# Patient Record
Sex: Male | Born: 1970 | Race: White | Hispanic: No | Marital: Married | Smoking: Never smoker
Health system: Southern US, Community
[De-identification: ages and names within clinical notes are randomized; demographics above are authoritative.]

## PROBLEM LIST (undated history)

## (undated) DIAGNOSIS — T7840XA Allergy, unspecified, initial encounter: Secondary | ICD-10-CM

## (undated) DIAGNOSIS — N529 Male erectile dysfunction, unspecified: Secondary | ICD-10-CM

## (undated) HISTORY — DX: Male erectile dysfunction, unspecified: N52.9

## (undated) HISTORY — DX: Allergy, unspecified, initial encounter: T78.40XA

---

## 2006-11-09 ENCOUNTER — Emergency Department (HOSPITAL_COMMUNITY): Admission: EM | Admit: 2006-11-09 | Discharge: 2006-11-09 | Payer: Self-pay | Admitting: Emergency Medicine

## 2006-11-22 ENCOUNTER — Ambulatory Visit: Payer: Self-pay | Admitting: Family Medicine

## 2007-03-18 ENCOUNTER — Ambulatory Visit: Payer: Self-pay | Admitting: Family Medicine

## 2008-05-04 HISTORY — PX: WISDOM TOOTH EXTRACTION: SHX21

## 2008-06-05 ENCOUNTER — Inpatient Hospital Stay (HOSPITAL_COMMUNITY): Admission: EM | Admit: 2008-06-05 | Discharge: 2008-06-06 | Payer: Self-pay | Admitting: Emergency Medicine

## 2008-09-10 ENCOUNTER — Ambulatory Visit: Payer: Self-pay | Admitting: Family Medicine

## 2009-03-04 LAB — HM COLONOSCOPY: HM Colonoscopy: NORMAL

## 2009-03-25 ENCOUNTER — Ambulatory Visit: Payer: Self-pay | Admitting: Family Medicine

## 2009-04-07 IMAGING — CR DG CHEST 1V PORT
1 series · 1 of 1 positions shown · non-contrast
Comparison: CT abdomen from the same day.

CLINICAL DATA: 37-year-old male with nausea, vomiting and diarrhea.

PORTABLE CHEST - 1 VIEW

[view not recorded]
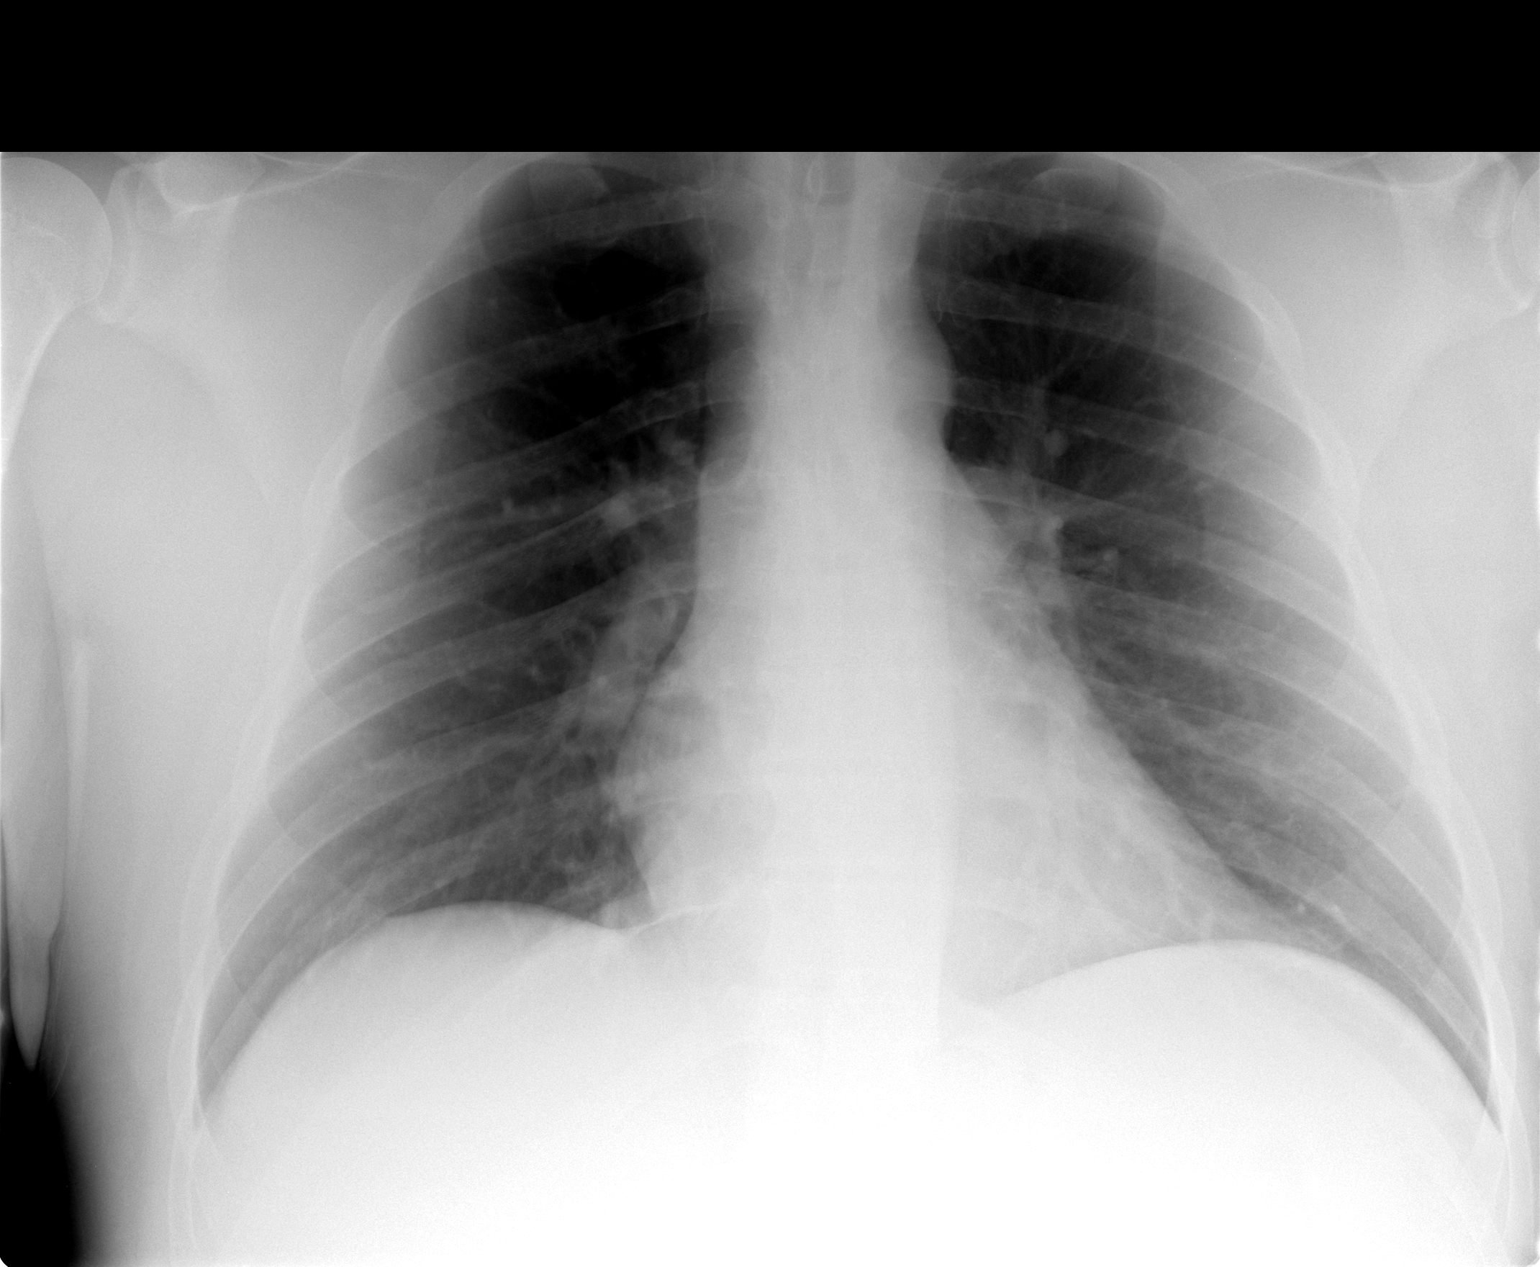

[1 of 1 positions shown; findings below may reference images not displayed]

FINDINGS: AP view at 8039 hours.  Normal lung volumes.  Cardiac
size and mediastinal contours are within normal limits.  No
pneumothorax.  The lungs are clear.
IMPRESSION: No acute cardiopulmonary abnormality.

## 2009-04-08 IMAGING — CR DG CHEST 2V
2 series · 2 of 2 positions shown · non-contrast
Comparison: 06/05/2008

CLINICAL DATA: Cough, congestion.

CHEST - 2 VIEW

[w chest pa]
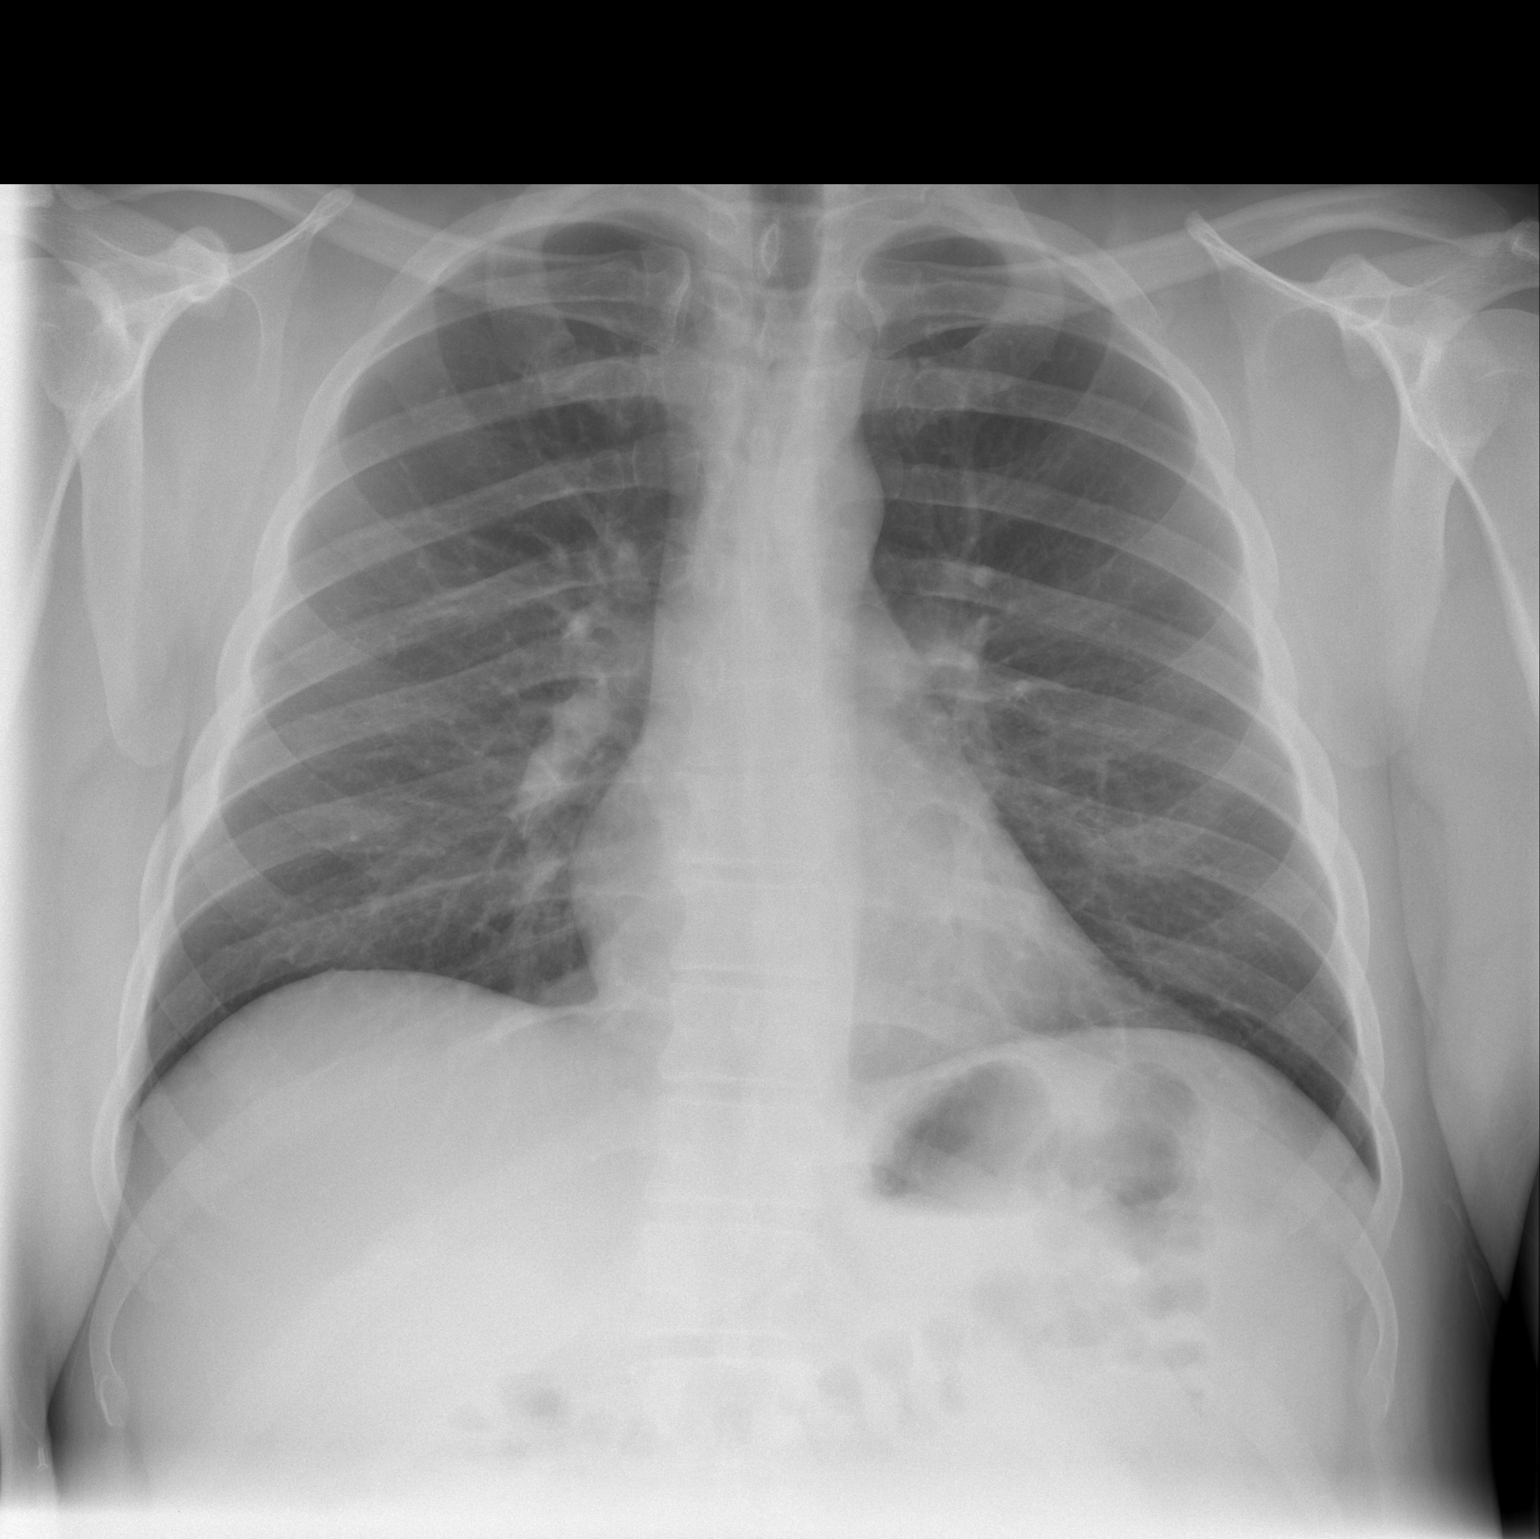

[w chest lat]
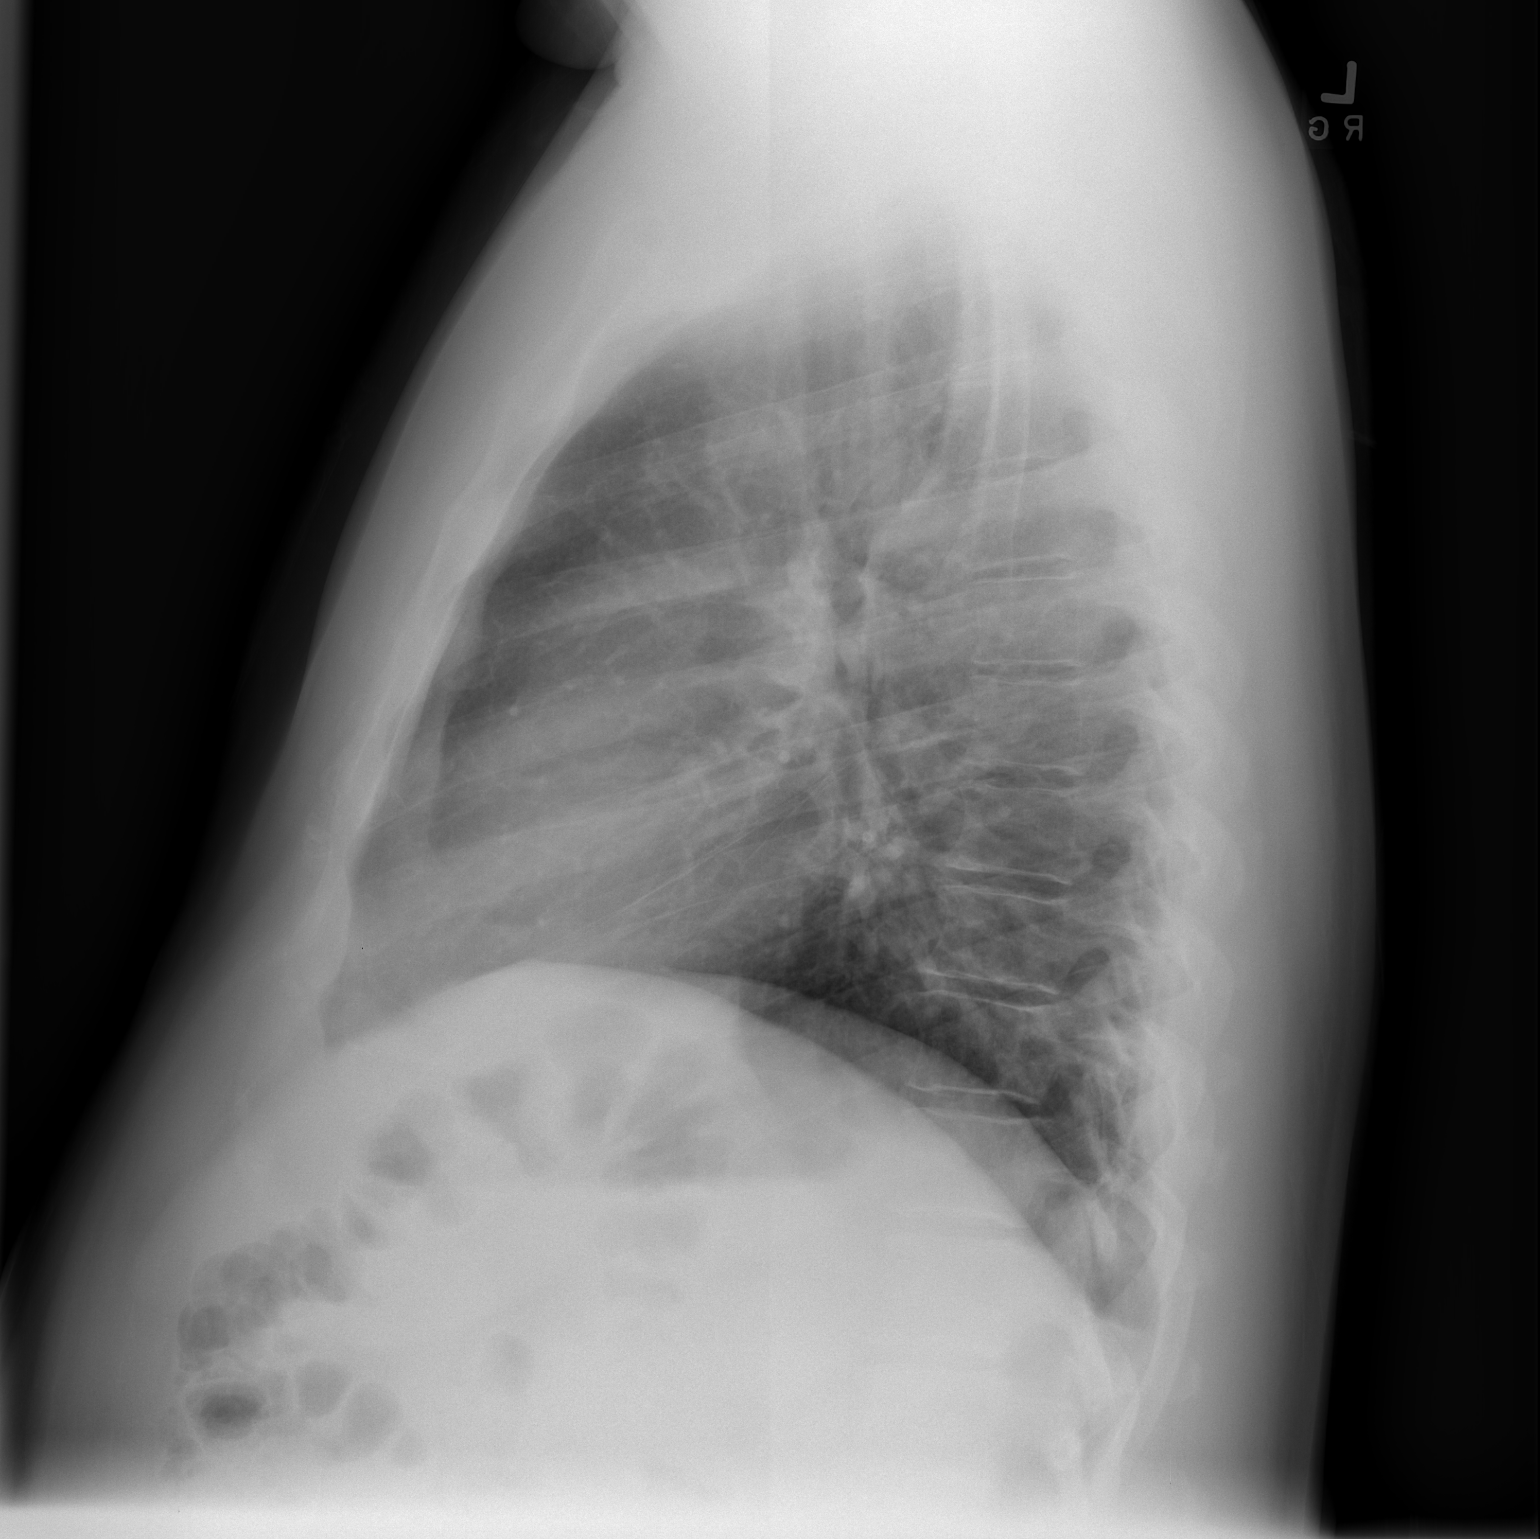

[2 of 2 positions shown; findings below may reference images not displayed]

FINDINGS: Heart and mediastinal contours are within normal limits.
No focal opacities or effusions.  No acute bony abnormality.
IMPRESSION: No active disease.

REF:W2 DICTATED: 06/06/2008 [DATE]

## 2010-06-03 ENCOUNTER — Ambulatory Visit
Admission: RE | Admit: 2010-06-03 | Discharge: 2010-06-03 | Payer: Self-pay | Source: Home / Self Care | Attending: Family Medicine | Admitting: Family Medicine

## 2010-08-19 LAB — CBC
HCT: 39.2 % (ref 39.0–52.0)
HCT: 47.3 % (ref 39.0–52.0)
HCT: 52.4 % — ABNORMAL HIGH (ref 39.0–52.0)
Hemoglobin: 13.5 g/dL (ref 13.0–17.0)
Hemoglobin: 16 g/dL (ref 13.0–17.0)
MCHC: 33.8 g/dL (ref 30.0–36.0)
MCHC: 34.5 g/dL (ref 30.0–36.0)
MCV: 88.1 fL (ref 78.0–100.0)
Platelets: 274 10*3/uL (ref 150–400)
Platelets: 349 10*3/uL (ref 150–400)
Platelets: 399 10*3/uL (ref 150–400)
RDW: 13 % (ref 11.5–15.5)
RDW: 13.1 % (ref 11.5–15.5)
WBC: 17.6 10*3/uL — ABNORMAL HIGH (ref 4.0–10.5)

## 2010-08-19 LAB — DIFFERENTIAL
Basophils Absolute: 0 10*3/uL (ref 0.0–0.1)
Basophils Absolute: 0 10*3/uL (ref 0.0–0.1)
Basophils Relative: 0 % (ref 0–1)
Basophils Relative: 0 % (ref 0–1)
Eosinophils Absolute: 0 10*3/uL (ref 0.0–0.7)
Eosinophils Absolute: 0 10*3/uL (ref 0.0–0.7)
Eosinophils Relative: 0 % (ref 0–5)
Eosinophils Relative: 0 % (ref 0–5)
Lymphocytes Relative: 21 % (ref 12–46)
Lymphocytes Relative: 3 % — ABNORMAL LOW (ref 12–46)
Lymphs Abs: 0.4 10*3/uL — ABNORMAL LOW (ref 0.7–4.0)
Monocytes Absolute: 0.6 10*3/uL (ref 0.1–1.0)
Monocytes Absolute: 0.6 10*3/uL (ref 0.1–1.0)
Monocytes Relative: 16 % — ABNORMAL HIGH (ref 3–12)
Monocytes Relative: 3 % (ref 3–12)
Monocytes Relative: 4 % (ref 3–12)
Neutro Abs: 3.6 10*3/uL (ref 1.7–7.7)
Neutrophils Relative %: 62 % (ref 43–77)

## 2010-08-19 LAB — CULTURE, BLOOD (ROUTINE X 2): Culture: NO GROWTH

## 2010-08-19 LAB — AMYLASE: Amylase: 43 U/L (ref 27–131)

## 2010-08-19 LAB — COMPREHENSIVE METABOLIC PANEL
ALT: 64 U/L — ABNORMAL HIGH (ref 0–53)
AST: 32 U/L (ref 0–37)
Albumin: 4.2 g/dL (ref 3.5–5.2)
Alkaline Phosphatase: 33 U/L — ABNORMAL LOW (ref 39–117)
Alkaline Phosphatase: 49 U/L (ref 39–117)
BUN: 10 mg/dL (ref 6–23)
BUN: 16 mg/dL (ref 6–23)
Chloride: 103 mEq/L (ref 96–112)
Creatinine, Ser: 0.87 mg/dL (ref 0.4–1.5)
GFR calc Af Amer: >60 mL/min (ref >60)
Glucose, Bld: 121 mg/dL — ABNORMAL HIGH (ref 70–99)
Potassium: 3.8 mEq/L (ref 3.5–5.1)
Potassium: 4 mEq/L (ref 3.5–5.1)
Sodium: 136 mEq/L (ref 135–145)
Total Bilirubin: 1.9 mg/dL — ABNORMAL HIGH (ref 0.3–1.2)
Total Protein: 5.6 g/dL — ABNORMAL LOW (ref 6.0–8.3)
Total Protein: 7.4 g/dL (ref 6.0–8.3)

## 2010-08-19 LAB — URINE CULTURE: Colony Count: NO GROWTH

## 2010-08-19 LAB — TROPONIN I: Troponin I: <0.01 ng/mL (ref 0.00–0.06)

## 2010-08-19 LAB — URINALYSIS, MICROSCOPIC ONLY
Glucose, UA: 100 mg/dL — AB
Hgb urine dipstick: NEGATIVE
Protein, ur: 100 mg/dL — AB
Urobilinogen, UA: 0.2 mg/dL (ref 0.0–1.0)

## 2010-08-19 LAB — STOOL CULTURE

## 2010-08-19 LAB — OVA AND PARASITE EXAMINATION

## 2010-08-19 LAB — CK TOTAL AND CKMB (NOT AT ARMC): Relative Index: 0.6 (ref 0.0–2.5)

## 2010-09-16 NOTE — H&P (Signed)
NAME:  Frank Orozco, Frank Orozco NO.:  [id number]   MEDICAL RECORD NO.:  [id number]          PATIENT TYPE:  EMS   LOCATION:  ED                           FACILITY:  [facility]   PHYSICIAN:  Richarda Overlie, MD       DATE OF BIRTH:  [birth year]   DATE OF ADMISSION:  06/05/2008  DATE OF DISCHARGE:                              HISTORY & PHYSICAL   SUBJECTIVE:  This is 40 year old male with no significant past medical  history who presents to the ER with a chief complaint of nausea,  vomiting, diarrhea starting last night.  The patient gives a history of  sudden onset of vomiting and diarrhea at around 9 p.m. yesterday evening  associated with diffuse abdominal cramps.  He denied any fever, chills  or rigors at home.  The patient had similar symptoms about 2 weeks ago  at which time he was seen at [facility] and treated for viral  gastroenteritis.  The patient was subsequently symptom free until last  night and he had a recurrence.  He denies any blood in his stool or  black tarry stools.  Denied any significant weight loss or weight gain.  He denies any history of inflammatory bowel disease.  Denies any melena  or blood in his stool.  He states that his diarrhea has been liquid  brown and he has been taking Pepto-Bismol, but he has been unable to  keep his medications down.  He denies any urinary symptoms of urgency,  frequency or dysuria.  No history of kidney stones.  No history of  gallstones.  He states that his wife had similar symptoms about a couple  of weeks ago.  He had minimal response to Reglan in the ER and therefore  is being admitted for IV hydration.   PAST MEDICAL HISTORY:  None.   FAMILY HISTORY:  Noncontributory.   SOCIAL HISTORY:  No history of drug abuse or alcohol abuse.  He is a  nonsmoker.   REVIEW OF SYSTEMS:  As in HPI.   PHYSICAL EXAMINATION:  VITAL SIGNS:  Blood pressure 109/74, pulse of 92,  respirations 18, 97% on 2 liters.  GENERAL:  The patient  appears to be comfortable, in no acute distress.  HEENT:  Pupils equal and reactive.  Extraocular movements intact.  LUNGS:  Clear to auscultation bilaterally.  No wheezes or crackles or  rhonchi.  CARDIOVASCULAR:  Regular rate and rhythm.  ABDOMEN:  Soft, nontender, nondistended.  Hypoactive bowel sounds.  EXTREMITIES:  Without cyanosis, clubbing or edema.  NEUROLOGIC:  Cranial nerves II-XII grossly intact.   LABS:  WBC 17.6, hemoglobin 17.5, hematocrit 52.4, platelet count of  399.  Sodium 136, potassium 4.0, chloride 103, glucose 194, BUN 16,  creatinine 0.84, T-bili 1.9, ALT 64, AST 32.  Abdominal CT scan shows no acute inflammatory changes identified in the  abdomen.  No acute inflammatory changes identified in the pelvis.  Chest  x-ray and urinalysis are pending.   ASSESSMENT AND PLAN:  1. Viral gastroenteritis.  2. Dehydration.  3. Nausea, vomiting, probably ileus related to viral gastroenteritis.  4. Leukocytosis.   Chest x-ray, urinalysis pending.   PLAN:  1. Admit the patient to a medical bed.  Hydrate the patient for      intractable nausea, vomiting, probably related to viral      gastroenteritis.  Start him on antiemetics IV and clear liquid diet      only.  Check a lipase, urinalysis and a chest x-ray.  I do not      suspect any pneumonia based on clinical exam.  Therefore, will wait      until we get the results of the chest x-ray before initiation of      antibiotic treatment.  2. The patient will be treated symptomatically for the diarrhea.  Will      check stool WBCs, C diff toxin A and B culture and sensitivity.  If      no evidence of any infectious etiology or bacterial cause, will      start the patient on Imodium.  The patient is a full code.      Richarda Overlie, MD  Electronically Signed     NA/MEDQ  D:  06/05/2008  T:  06/05/2008  Job:  [id number]

## 2010-09-16 NOTE — Discharge Summary (Signed)
NAMEADONIS, Frank Orozco                ACCOUNT NO.:  [id number]   MEDICAL RECORD NO.:  [id number]          PATIENT TYPE:  INP   LOCATION:  1534                         FACILITY:  [facility]   PHYSICIAN:  Richarda Overlie, MD       DATE OF BIRTH:  1970/12/15   DATE OF ADMISSION:  06/05/2008  DATE OF DISCHARGE:  06/06/2008                               DISCHARGE SUMMARY   DISCHARGE DIAGNOSES:  1. Viral gastroenteritis.  2. Chest pain likely pleuritic.  3. Dehydration.  4. Nausea, vomiting related to ileus, secondary to viral      gastroenteritis.   DISCHARGE MEDICATIONS:  1. Albuterol MDI 2 puffs q. 4 h. p.r.n.  2. Ibuprofen 600 mg p.o. q. 8 h. times 5 days, then q. 8 h. p.r.n.  3. Phenergan 25 mg p.o. q. 6 h. p.r.n. nausea.  4. Prilosec 20 mg p.o. daily.  5. Imodium 2 mg p.o. q 4 h. p.r.n. diarrhea.  6. Z-Pak.   SUBJECTIVE:  This is a 40 year old male who presented to the ER with  chief complaint of nausea, vomiting and diarrhea for the last 10 hours  with multiple episodes; recently treated for viral gastroenteritis at  [facility].  The patient had tried over-the-counter Pepto-Bismol  without much relieve.  He presented to the emergency room with the above  symptoms.  1. In the ER, the patient was found to be clinically dehydrated and      was admitted for aggressive IV hydration therapy.  The patient had      a CT scan of the abdomen and pelvis that showed no acute or      inflammatory changes in the abdomen.  No mesenteric      lymphadenopathy.  No acute inflammatory process was identified in      the pelvis.  The patient was found to have mild elevation in his      white count of 14.8 with a left shift and mild elevation in his      total bilirubin and ALT.  His stools were checked for C. diff      toxin, which was negative.  Stool studies were performed and no      growth was observed on culture.  The patient's blood cultures      remained negative.  The patient was treated  with aggressive IV      hydration and antiemetics with good resolution of his symptoms.      His white blood cell count was normal the subsequent day.  His      total bilirubin continued to improve.  The patient's nausea and      diarrhea had both resolved.  The diet was advanced to a mechanical      soft, lactose-free diet as tolerated.  2. Chest pain.  The patient complained of chest pain when taking in a      deep breath.  He did not have any evidence of any EKG changes.  His      serial troponins remained negative.  His chest pain was thought to  be secondary to pleurisy and the patient was started on Ibuprofen      600 mg p.o. q 8 h.  He was also initiated on a Z-pak which he will      continue to take following his discharge.   He is recommended to follow up with his primary care physician in 5 to 7  days, advised to drink plenty of fluids and keep himself well hydrated.      Richarda Overlie, MD  Electronically Signed     NA/MEDQ  D:  06/06/2008  T:  06/06/2008  Job:  [id number]

## 2010-10-10 ENCOUNTER — Encounter: Payer: Self-pay | Admitting: Family Medicine

## 2010-10-13 ENCOUNTER — Ambulatory Visit (INDEPENDENT_AMBULATORY_CARE_PROVIDER_SITE_OTHER): Payer: Managed Care, Other (non HMO) | Admitting: Family Medicine

## 2010-10-13 ENCOUNTER — Encounter: Payer: Self-pay | Admitting: Family Medicine

## 2010-10-13 VITALS — BP 140/90 | HR 76 | Wt 219.0 lb

## 2010-10-13 DIAGNOSIS — L0211 Cutaneous abscess of neck: Secondary | ICD-10-CM

## 2010-10-13 NOTE — Patient Instructions (Signed)
Keep the area clean and dry. Return here if any problems.

## 2010-10-13 NOTE — Progress Notes (Signed)
  Subjective:    Patient ID: Frank Orozco, male    DOB: 13-Jul-1970, 40 y.o.   MRN: [id number]  HPI he is here for evaluation of a lesion on the back of his neck that intermittently becomes raised, tender. He is able to express some fluid from. He did this yesterday.    Review of Systems     Objective:   Physical Exam a 1-1/2 cm raised erythematous recently, ties lesion is noted on the posterior neck area.        Assessment & Plan:  Abscess of the neck The wound was injected with Xylocaine. A 1 cm incision was made. A strand of hair was removed from the area. The wound was explored. No cyst sac was noted. The wound was cleaned and dressed.

## 2010-10-28 ENCOUNTER — Encounter: Payer: Self-pay | Admitting: Medical

## 2010-10-28 ENCOUNTER — Ambulatory Visit (INDEPENDENT_AMBULATORY_CARE_PROVIDER_SITE_OTHER): Payer: Managed Care, Other (non HMO) | Admitting: Medical

## 2010-10-28 VITALS — BP 110/88 | HR 66 | Temp 98.0°F | Ht 70.0 in | Wt 218.0 lb

## 2010-10-28 DIAGNOSIS — L678 Other hair color and hair shaft abnormalities: Secondary | ICD-10-CM

## 2010-10-28 DIAGNOSIS — L739 Follicular disorder, unspecified: Secondary | ICD-10-CM

## 2010-10-28 DIAGNOSIS — L738 Other specified follicular disorders: Secondary | ICD-10-CM

## 2010-10-28 MED ORDER — DOXYCYCLINE HYCLATE 100 MG PO TABS: 100.0000 mg | ORAL_TABLET | Freq: Two times a day (BID) | ORAL | Status: AC

## 2010-10-28 NOTE — Patient Instructions (Signed)
Call if not resolved in 2-3 weeks.

## 2010-10-28 NOTE — Progress Notes (Signed)
Subjective:   HPI  Frank Orozco is a 40 y.o. male who presents for complaint of skin lesion on the back of his neck. He has been seen by Dr. Susann Givens a few weeks ago for the same reason, and at that time and I and D. was performed for suspected abscess and ingrown hair. Since then, he notes that the lesion won't go away. He notes that it raises out like a pimple, and he is able to get pus like material out every 3 days or so. Otherwise its just aggravating. He denies warmth, fever, chills, nausea. No other similar lesion.No other aggravating or relieving factors.  No other c/o.  The following portions of the patient's history were reviewed and updated as appropriate: allergies, current medications, past family history, past medical history, past social history, past surgical history and problem list.   Review of Systems Constitutional: denies fever, chills, sweats Gastroenterology: denies abdominal pain, nausea, vomiting, diarrhea Musculoskeletal: denies arthralgias, myalgias, joint swelling, back pain Neurology: no headache, weakness, tingling, numbness      Objective:   Physical Exam  General appearance: alert, no distress, WD/WN, white male Skin:  Right posterior scalp behind the ear within the hairline is a 1 cm erythematous raised, crusted lesion that feels somewhat cystlike. No induration, fluctuance, warmth. Mildly tender no discharge currently Neck: supple, no lymphadenopathy, no thyromegaly  Assessment :    Encounter Diagnosis  Name Primary?  . Folliculitis Yes     Plan:  I reviewed the most recent office note pertaining to this same issue. At this point we will use a round of doxycycline 100 mg twice a day for 10 days, warm moist compresses, hydrocortisone cream topically over-the-counter.  If not completely resolved in 2-3 weeks, consider referral to dermatology or Gen. surgery.

## 2011-02-17 LAB — DIFFERENTIAL
Basophils Relative: 0
Lymphs Abs: 1.4
Monocytes Relative: 15 — ABNORMAL HIGH
Neutro Abs: 6.1
Neutrophils Relative %: 69

## 2011-02-17 LAB — URINALYSIS, ROUTINE W REFLEX MICROSCOPIC
Bilirubin Urine: NEGATIVE
Glucose, UA: NEGATIVE
Hgb urine dipstick: NEGATIVE
Ketones, ur: 15 — AB
Protein, ur: 30 — AB

## 2011-02-17 LAB — COMPREHENSIVE METABOLIC PANEL
Alkaline Phosphatase: 40
BUN: 7
CO2: 26
Calcium: 8.9
GFR calc non Af Amer: >60
Glucose, Bld: 118 — ABNORMAL HIGH
Total Protein: 6.8

## 2011-02-17 LAB — CBC
HCT: 48.3
Hemoglobin: 17.1 — ABNORMAL HIGH
MCHC: 35.4
RDW: 13

## 2011-06-24 ENCOUNTER — Ambulatory Visit (INDEPENDENT_AMBULATORY_CARE_PROVIDER_SITE_OTHER): Payer: Managed Care, Other (non HMO) | Admitting: Family Medicine

## 2011-06-24 ENCOUNTER — Encounter: Payer: Self-pay | Admitting: Family Medicine

## 2011-06-24 DIAGNOSIS — Z0000 Encounter for general adult medical examination without abnormal findings: Secondary | ICD-10-CM

## 2011-06-24 DIAGNOSIS — E669 Obesity, unspecified: Secondary | ICD-10-CM

## 2011-06-24 DIAGNOSIS — Z111 Encounter for screening for respiratory tuberculosis: Secondary | ICD-10-CM

## 2011-06-24 LAB — COMPREHENSIVE METABOLIC PANEL
ALT: 56 U/L — ABNORMAL HIGH (ref 0–53)
AST: 31 U/L (ref 0–37)
Albumin: 4.7 g/dL (ref 3.5–5.2)
Alkaline Phosphatase: 43 U/L (ref 39–117)
Calcium: 10.3 mg/dL (ref 8.4–10.5)
Chloride: 98 mEq/L (ref 96–112)
Potassium: 4.8 mEq/L (ref 3.5–5.3)
Sodium: 137 mEq/L (ref 135–145)
Total Protein: 7.2 g/dL (ref 6.0–8.3)

## 2011-06-24 LAB — CBC WITH DIFFERENTIAL/PLATELET
Basophils Absolute: 0 10*3/uL (ref 0.0–0.1)
Basophils Relative: 0 % (ref 0–1)
HCT: 49.4 % (ref 39.0–52.0)
Lymphocytes Relative: 32 % (ref 12–46)
MCHC: 34.6 g/dL (ref 30.0–36.0)
Monocytes Absolute: 1.7 10*3/uL — ABNORMAL HIGH (ref 0.1–1.0)
Neutro Abs: 4.4 10*3/uL (ref 1.7–7.7)
Neutrophils Relative %: 48 % (ref 43–77)
Platelets: 311 10*3/uL (ref 150–400)
RDW: 12.7 % (ref 11.5–15.5)
WBC: 9.3 10*3/uL (ref 4.0–10.5)

## 2011-06-24 LAB — LIPID PANEL
HDL: 44 mg/dL (ref >39)
LDL Cholesterol: 63 mg/dL (ref 0–99)

## 2011-06-24 NOTE — Progress Notes (Signed)
  Subjective:    Patient ID: Frank Orozco, male    DOB: 1970-12-04, 41 y.o.   MRN: [id number]  HPI He is here for complete examination. He and his wife are in the process of adopting another child. He did have a 60-year-old. He has no concerns or complaints.   Review of Systems  Constitutional: Negative.   HENT: Negative.   Eyes: Negative.   Respiratory: Negative.   Cardiovascular: Negative.   Gastrointestinal: Negative.   Genitourinary: Negative.   Musculoskeletal: Negative.   Skin: Negative.   Neurological: Negative.   Hematological: Negative.   Psychiatric/Behavioral: Negative.        Objective:   Physical Exam BP 120/84  Pulse 87  Ht 5' 9.5" (1.765 m)  Wt 230 lb (104.327 kg)  BMI 33.48 kg/m2  General Appearance:    Alert, cooperative, no distress, appears stated age  Head:    Normocephalic, without obvious abnormality, atraumatic  Eyes:    PERRL, conjunctiva/corneas clear, EOM's intact, fundi    benign  Ears:    Normal TM's and external ear canals  Nose:   Nares normal, mucosa normal, no drainage or sinus   tenderness  Throat:   Lips, mucosa, and tongue normal; teeth and gums normal  Neck:   Supple, no lymphadenopathy;  thyroid:  no   enlargement/tenderness/nodules; no carotid   bruit or JVD  Back:    Spine nontender, no curvature, ROM normal, no CVA     tenderness  Lungs:     Clear to auscultation bilaterally without wheezes, rales or     ronchi; respirations unlabored  Chest Wall:    No tenderness or deformity   Heart:    Regular rate and rhythm, S1 and S2 normal, no murmur, rub   or gallop  Breast Exam:    No chest wall tenderness, masses or gynecomastia  Abdomen:     Soft, non-tender, nondistended, normoactive bowel sounds,    no masses, no hepatosplenomegaly  Genitalia:    Normal male external genitalia without lesions.  Testicles without masses.  No inguinal hernias.  Rectal:    Normal sphincter tone, no masses or tenderness; guaiac negative stool.  Prostate  smooth, no nodules, not enlarged.  Extremities:   No clubbing, cyanosis or edema  Pulses:   2+ and symmetric all extremities  Skin:   Skin color, texture, turgor normal, no rashes or lesions  Lymph nodes:   Cervical, supraclavicular, and axillary nodes normal  Neurologic:   CNII-XII intact, normal strength, sensation and gait; reflexes 2+ and symmetric throughout          Psych:   Normal mood, affect, hygiene and grooming.           Assessment & Plan:   1. Routine general medical examination at a health care facility  PR ELECTROCARDIOGRAM, COMPLETE, CBC with Differential, Comprehensive metabolic panel, Lipid panel, POCT occult blood stool  2. Screening examination for pulmonary tuberculosis  TB Skin Test  3. Obesity (BMI 30-39.9)    Try to get about 150 minutes a week of some kind of physical activity. Also keep in mind when I said about cutting back on carbohydrates. Also work on reducing your drinking of [employer]

## 2011-06-24 NOTE — Patient Instructions (Addendum)
Try to get about 150 minutes a week of some kind of physical activity. Also keep in mind when I said about cutting back on carbohydrates. Also work on reducing your drinking of [employer]

## 2011-06-25 NOTE — Progress Notes (Signed)
Quick Note:  The blood work is normal ______ 

## 2011-06-26 LAB — TB SKIN TEST: TB Skin Test: NEGATIVE mm

## 2012-11-01 ENCOUNTER — Other Ambulatory Visit: Payer: Self-pay | Admitting: Urology

## 2012-11-02 ENCOUNTER — Encounter (HOSPITAL_COMMUNITY): Payer: Self-pay | Admitting: *Deleted

## 2012-11-09 ENCOUNTER — Encounter (HOSPITAL_COMMUNITY): Payer: Self-pay | Admitting: Anesthesiology

## 2012-11-09 ENCOUNTER — Encounter (HOSPITAL_COMMUNITY)
Admission: RE | Disposition: A | Discharge status: Home / Self Care | Payer: Self-pay | Source: Ambulatory Visit | Attending: Urology

## 2012-11-09 ENCOUNTER — Ambulatory Visit (HOSPITAL_COMMUNITY): Payer: Managed Care, Other (non HMO) | Admitting: Anesthesiology

## 2012-11-09 ENCOUNTER — Ambulatory Visit (HOSPITAL_COMMUNITY)
Admission: RE | Admit: 2012-11-09 | Discharge: 2012-11-09 | Disposition: A | Discharge status: Home / Self Care | Payer: Managed Care, Other (non HMO) | Source: Ambulatory Visit | Attending: Urology | Admitting: Urology

## 2012-11-09 ENCOUNTER — Encounter (HOSPITAL_COMMUNITY): Payer: Self-pay | Admitting: *Deleted

## 2012-11-09 DIAGNOSIS — Z302 Encounter for sterilization: Secondary | ICD-10-CM

## 2012-11-09 HISTORY — PX: VASECTOMY: SHX75

## 2012-11-09 SURGERY — VASECTOMY
Anesthesia: Monitor Anesthesia Care | Site: Scrotum | Laterality: Bilateral | Wound class: Clean Contaminated

## 2012-11-09 MED ORDER — HYDROMORPHONE HCL PF 1 MG/ML IJ SOLN: 0.2500 mg | INTRAMUSCULAR | Status: DC | PRN

## 2012-11-09 MED ORDER — ACETAMINOPHEN 10 MG/ML IV SOLN
1000.0000 mg | Freq: Once | INTRAVENOUS | Status: DC | PRN
  Filled 2012-11-09: qty 100

## 2012-11-09 MED ORDER — MIDAZOLAM HCL 5 MG/5ML IJ SOLN
INTRAMUSCULAR | Status: DC | PRN
  Administered 2012-11-09: 2 mg via INTRAVENOUS

## 2012-11-09 MED ORDER — OXYCODONE HCL 5 MG PO TABS: 5.0000 mg | ORAL_TABLET | Freq: Once | ORAL | Status: DC | PRN

## 2012-11-09 MED ORDER — LACTATED RINGERS IV SOLN
INTRAVENOUS | Status: DC
  Administered 2012-11-09: 1000 mL via INTRAVENOUS
  Administered 2012-11-09: 19:00:00 via INTRAVENOUS

## 2012-11-09 MED ORDER — PROPOFOL 10 MG/ML IV BOLUS
INTRAVENOUS | Status: DC | PRN
  Administered 2012-11-09: 200 mg via INTRAVENOUS

## 2012-11-09 MED ORDER — 0.9 % SODIUM CHLORIDE (POUR BTL) OPTIME
TOPICAL | Status: DC | PRN
  Administered 2012-11-09: 1000 mL

## 2012-11-09 MED ORDER — MEPERIDINE HCL 50 MG/ML IJ SOLN: 6.2500 mg | INTRAMUSCULAR | Status: DC | PRN

## 2012-11-09 MED ORDER — CEPHALEXIN 500 MG PO CAPS: 500.0000 mg | ORAL_CAPSULE | Freq: Three times a day (TID) | ORAL | Status: DC

## 2012-11-09 MED ORDER — BUPIVACAINE HCL 0.25 % IJ SOLN
INTRAMUSCULAR | Status: DC | PRN
  Administered 2012-11-09: 10 mL

## 2012-11-09 MED ORDER — OXYCODONE-ACETAMINOPHEN 5-325 MG PO TABS: 1.0000 | ORAL_TABLET | ORAL | Status: DC | PRN

## 2012-11-09 MED ORDER — PROMETHAZINE HCL 25 MG/ML IJ SOLN
6.2500 mg | INTRAMUSCULAR | Status: DC | PRN
  Administered 2012-11-09: 12.5 mg via INTRAVENOUS

## 2012-11-09 MED ORDER — BACITRACIN ZINC 500 UNIT/GM EX OINT
TOPICAL_OINTMENT | CUTANEOUS | Status: DC | PRN
  Administered 2012-11-09: 1 via TOPICAL

## 2012-11-09 MED ORDER — PROMETHAZINE HCL 25 MG/ML IJ SOLN
INTRAMUSCULAR | Status: AC
  Filled 2012-11-09: qty 1

## 2012-11-09 MED ORDER — PROMETHAZINE HCL 25 MG/ML IJ SOLN: 6.2500 mg | INTRAMUSCULAR | Status: DC | PRN

## 2012-11-09 MED ORDER — OXYCODONE HCL 5 MG/5ML PO SOLN: 5.0000 mg | Freq: Once | ORAL | Status: DC | PRN

## 2012-11-09 MED ORDER — BUPIVACAINE HCL (PF) 0.25 % IJ SOLN
INTRAMUSCULAR | Status: AC
  Filled 2012-11-09: qty 30

## 2012-11-09 MED ORDER — OXYCODONE HCL 5 MG/5ML PO SOLN
5.0000 mg | Freq: Once | ORAL | Status: DC | PRN
  Filled 2012-11-09: qty 5

## 2012-11-09 MED ORDER — CEFAZOLIN SODIUM-DEXTROSE 2-3 GM-% IV SOLR
2.0000 g | INTRAVENOUS | Status: AC
  Administered 2012-11-09: 2 g via INTRAVENOUS

## 2012-11-09 MED ORDER — FENTANYL CITRATE 0.05 MG/ML IJ SOLN
INTRAMUSCULAR | Status: DC | PRN
  Administered 2012-11-09: 50 ug via INTRAVENOUS
  Administered 2012-11-09: 25 ug via INTRAVENOUS

## 2012-11-09 MED ORDER — ACETAMINOPHEN 10 MG/ML IV SOLN: 1000.0000 mg | Freq: Once | INTRAVENOUS | Status: DC | PRN

## 2012-11-09 MED ORDER — CEFAZOLIN SODIUM-DEXTROSE 2-3 GM-% IV SOLR
INTRAVENOUS | Status: AC
  Filled 2012-11-09: qty 50

## 2012-11-09 MED ORDER — BACITRACIN ZINC 500 UNIT/GM EX OINT
TOPICAL_OINTMENT | CUTANEOUS | Status: AC
  Filled 2012-11-09: qty 15

## 2012-11-09 SURGICAL SUPPLY — 38 items
BANDAGE CONFORM 2  STR LF (GAUZE/BANDAGES/DRESSINGS) IMPLANT
BLADE SURG 15 STRL LF DISP TIS (BLADE) ×1 IMPLANT
BLADE SURG 15 STRL SS (BLADE) ×1
BNDG COHESIVE 1X5 TAN STRL LF (GAUZE/BANDAGES/DRESSINGS) IMPLANT
CATH ROBINSON RED A/P 16FR (CATHETERS) IMPLANT
CLOTH BEACON ORANGE TIMEOUT ST (SAFETY) ×2 IMPLANT
COVER SURGICAL LIGHT HANDLE (MISCELLANEOUS) ×2 IMPLANT
DRAPE PED LAPAROTOMY (DRAPES) ×2 IMPLANT
ELECT NEEDLE TIP 2.8 STRL (NEEDLE) ×2 IMPLANT
ELECT REM PT RETURN 9FT ADLT (ELECTROSURGICAL) ×2
ELECTRODE REM PT RTRN 9FT ADLT (ELECTROSURGICAL) ×1 IMPLANT
GAUZE SPONGE 2X2 8PLY STRL LF (GAUZE/BANDAGES/DRESSINGS) ×2 IMPLANT
GAUZE SPONGE 4X4 16PLY XRAY LF (GAUZE/BANDAGES/DRESSINGS) ×2 IMPLANT
GAUZE VASELINE 1X8 (GAUZE/BANDAGES/DRESSINGS) IMPLANT
GLOVE BIOGEL M 7.0 STRL (GLOVE) IMPLANT
GLOVE BIOGEL PI IND STRL 7.0 (GLOVE) ×1 IMPLANT
GLOVE BIOGEL PI IND STRL 7.5 (GLOVE) IMPLANT
GLOVE BIOGEL PI INDICATOR 7.0 (GLOVE) ×1
GLOVE BIOGEL PI INDICATOR 7.5 (GLOVE)
GLOVE ECLIPSE 7.0 STRL STRAW (GLOVE) IMPLANT
GLOVE ECLIPSE 7.5 STRL STRAW (GLOVE) IMPLANT
GLOVE SURG SS PI 6.5 STRL IVOR (GLOVE) ×2 IMPLANT
GOWN STRL REIN XL XLG (GOWN DISPOSABLE) ×4 IMPLANT
KIT BASIN OR (CUSTOM PROCEDURE TRAY) ×2 IMPLANT
NEEDLE HYPO 25X1 1.5 SAFETY (NEEDLE) ×2 IMPLANT
NS IRRIG 1000ML POUR BTL (IV SOLUTION) ×2 IMPLANT
PACK BASIC VI WITH GOWN DISP (CUSTOM PROCEDURE TRAY) ×2 IMPLANT
PENCIL BUTTON HOLSTER BLD 10FT (ELECTRODE) ×2 IMPLANT
SCRUB PCMX 4 OZ (MISCELLANEOUS) ×2 IMPLANT
SPONGE GAUZE 2X2 STER 10/PKG (GAUZE/BANDAGES/DRESSINGS) ×2
SUT CHROMIC 2 0 SH (SUTURE) IMPLANT
SUT CHROMIC 3 0 PS 2 (SUTURE) IMPLANT
SUT CHROMIC 3 0 SH 27 (SUTURE) ×2 IMPLANT
SUT SILK 2 0 (SUTURE)
SUT SILK 2-0 18XBRD TIE 12 (SUTURE) IMPLANT
SYR CONTROL 10ML LL (SYRINGE) ×2 IMPLANT
TOWEL NATURAL 10PK STERILE (DISPOSABLE) ×4 IMPLANT
WATER STERILE IRR 1500ML POUR (IV SOLUTION) IMPLANT

## 2012-11-09 NOTE — Anesthesia Preprocedure Evaluation (Addendum)
Anesthesia Evaluation  Patient identified by MRN, date of birth, ID band Patient awake    Reviewed: Allergy & Precautions, H&P , NPO status , Patient's Chart, lab work & pertinent test results  Airway Mallampati: II TM Distance: >3 FB Neck ROM: full    Dental no notable dental hx. (+) Dental Advisory Given   Pulmonary neg pulmonary ROS,  breath sounds clear to auscultation  Pulmonary exam normal       Cardiovascular Exercise Tolerance: Good negative cardio ROS  Rhythm:regular Rate:Normal     Neuro/Psych negative neurological ROS  negative psych ROS   GI/Hepatic negative GI ROS, Neg liver ROS,   Endo/Other  negative endocrine ROS  Renal/GU negative Renal ROS  negative genitourinary   Musculoskeletal negative musculoskeletal ROS (+)   Abdominal   Peds  Hematology negative hematology ROS (+)   Anesthesia Other Findings   Reproductive/Obstetrics negative OB ROS                          Anesthesia Physical Anesthesia Plan  ASA: I  Anesthesia Plan: MAC and General LMA   Post-op Pain Management:    Induction: Intravenous  Airway Management Planned: Simple Face Mask and Nasal Cannula  Additional Equipment:   Intra-op Plan:   Post-operative Plan:   Informed Consent: I have reviewed the patients History and Physical, chart, labs and discussed the procedure including the risks, benefits and alternatives for the proposed anesthesia with the patient or authorized representative who has indicated his/her understanding and acceptance.   Dental advisory given  Plan Discussed with: CRNA  Anesthesia Plan Comments:        Anesthesia Quick Evaluation

## 2012-11-09 NOTE — Op Note (Signed)
Urology Operative Report  Date of Procedure: 11/09/12  Surgeon: Natalia Leatherwood, MD Assistant:  None  Preoperative Diagnosis: Elective sterilization Postoperative Diagnosis:  Same  Procedure(s): Bilateral vasectomy Bilateral spermatic cord block.  Estimated blood loss: Minimal  Specimen: None  Drains: None  Complications: None  Findings: Normal vas deferens bilaterally.  History of present illness: Patient presents for elective sterilization. He elected to have this done in the OR due to hypersensitivity and difficulty with physical exam in the clinic.   Procedure in detail: After informed consent was obtained, the patient was taken to the operating room. They were placed in the supine position. SCDs were turned on and in place. IV antibiotics were infused, and general anesthesia was induced. A timeout was performed in which the correct patient, surgical site, and procedure were identified and agreed upon by the team.  The patient was placed in a supine position, making sure to pad all pertinent neurovascular pressure points. The genitals were prepped and draped in the usual sterile fashion. He had already removed is genital hair.  Following this, attention was turned to his left spermatic cord. The vas was isolated and brought up to the skin on the anterior scrotum. Injection of marcaine was then used to numb the skin over the vas deferens. After this the skin was spread apart with sharp-nosed hemostat. The ring forceps was then used to grasp the vas deferens and this was brought out through the defect in the skin. The left testicle was then pulled inferiorly to ensure that the left vas deferens was in the ring forcep. Lidocaine was then used to inject the area around the vas deferens and then cautery was used to cauterize the sheath parallel to the vas deferens. A hemostat was then used to peal the sheath away from the vas and the vas was regrasped with the ring forceps. A 4 x 4 was  then used to strip the vas proximally and distally.  Cautery was used to maintain hemostasis proximally and distally. After this, a curved clamp was placed proximally and distally on the vas deferens which was then sharply divided removing a greater than 1 cm segment of vas deferens. Mucosal cautery was then carried out on both ends of the vas. These were placed back into the scrotum after it was assured there was good hemostasis. A single 3-0 chromic stitch was then placed through the skin.   Following this attention was turned to his right spermatic cord.  The vas was isolated and brought up to the skin on the anterior scrotum. Injection of marcaine was then used to numb the skin over the vas deferens. After this the skin was spread apart with sharp-nosed hemostat. The ring forceps was then used to grasp the vas deferens and this was brought out through the defect in the skin. The right testicle was then pulled inferiorly to ensure that the right vas deferens was in the ring forcep. Lidocaine was then used to inject the area around the vas deferens and then cautery was used to cauterize the sheath parallel to the vas deferens. A hemostat was then used to peal the sheath away from the vas and the vas was regrasped with the ring forceps. A 4 x 4 was then used to strip the vas proximally and distally.  Cautery was used to maintain hemostasis proximally and distally. After this, a curved clamp was placed proximally and distally on the vas deferens which was then sharply divided removing a greater than 1 cm  segment of vas deferens. Mucosal cautery was then carried out on both ends of the vas. These were placed back into the scrotum after it was assured there was good hemostasis. A single 3-0 chromic stitch was then placed through the skin.  Patient tolerated the procedure well. Anesthesia was reversed and he was taken to the PACU in a stable condition.    All counts were correct at the end of the case.

## 2012-11-09 NOTE — Transfer of Care (Signed)
Immediate Anesthesia Transfer of Care Note  Patient: Frank Orozco  Procedure(s) Performed: Procedure(s) with comments: BILATERAL VASECTOMY (Bilateral) - SPERMATIC CORD BLOCK    Patient Location: PACU  Anesthesia Type:General  Level of Consciousness: awake, oriented, sedated and patient cooperative  Airway & Oxygen Therapy: Patient Spontanous Breathing and Patient connected to face mask oxygen  Post-op Assessment: Report given to PACU RN and Post -op Vital signs reviewed and stable  Post vital signs: Reviewed and stable  Complications: No apparent anesthesia complications

## 2012-11-09 NOTE — H&P (Signed)
Urology History and Physical Exam  CC: Visit for sterilization.  HPI:  42 year old male presents today for a vasectomy. This patient was seen in clinic where we discussed extensively the risks, benefits, side effects of vasectomy as well as alternatives. He has had no problems with fertility in the past. He's achieved 3 pregnancies with 3 children delivered. He's currently been married to his wife for 7 years. They have used other forms of birth control. He has no history of epididymitis or orchitis. We discussed extensively the risks as well as postoperative situations. On physical exam the patient was noted to have very high riding testicles especially on the left side which was painful for me to manipulate. It is also very difficult to palpate his vas deferens. Because of this he is elected to proceed with vasectomy under monitored anesthesia care and local anesthetic. We discussed the possibility of bilateral spermatic cord block.  PMH: Past Medical History  Diagnosis Date  . Erectile dysfunction     PSH: Past Surgical History  Procedure Laterality Date  . Wisdom tooth extraction  2010    Allergies: No Known Allergies  Medications: No prescriptions prior to admission     Social History: History   Social History  . Marital Status: Married    Spouse Name: N/A    Number of Children: N/A  . Years of Education: N/A   Occupational History  . Not on file.   Social History Main Topics  . Smoking status: Never Smoker   . Smokeless tobacco: Never Used  . Alcohol Use: Yes     Comment: 3 drinks a month  . Drug Use: No  . Sexually Active: Yes     Comment: [employer]   Other Topics Concern  . Not on file   Social History Narrative  . No narrative on file    Family History: Family History  Problem Relation Age of Onset  . Arthritis Mother     Review of Systems: Positive: None. Negative: Chest pain, SOB, or fever.  A further 10 point review of systems was  negative except what is listed in the HPI.  Physical Exam: Filed Vitals:   11/09/12 1332  BP: 143/94  Pulse: 70  Temp: 97.7 F (36.5 C)  Resp: 18    General: No acute distress.  Awake. Head:  Normocephalic.  Atraumatic. ENT:  EOMI.  Mucous membranes moist Neck:  Supple.  No lymphadenopathy. CV:  S1 present. S2 present. Regular rate. Pulmonary: Equal effort bilaterally.  Clear to auscultation bilaterally. Abdomen: Soft.  Non- tender to palpation. Skin:  Normal turgor.  No visible rash. Extremity: No gross deformity of bilateral upper extremities.  No gross deformity of    bilateral lower extremities. Neurologic: Alert. Appropriate mood.    Studies:  No results found for this basename: HGB, WBC, PLT,  in the last 72 hours  No results found for this basename: NA, K, CL, CO2, BUN, CREATININE, CALCIUM, MAGNESIUM, GFRNONAA, GFRAA,  in the last 72 hours   No results found for this basename: PT, INR, APTT,  in the last 72 hours   No components found with this basename: ABG,     Assessment:  Visit for sterilization  Plan: To OR for bilateral vasectomy and bilateral spermatic cord block.

## 2012-11-09 NOTE — Anesthesia Postprocedure Evaluation (Signed)
Anesthesia Post Note  Patient: Frank Orozco  Procedure(s) Performed: Procedure(s) (LRB): BILATERAL VASECTOMY (Bilateral)  Anesthesia type: General  Patient location: PACU  Post pain: Pain level controlled  Post assessment: Post-op Vital signs reviewed  Last Vitals: BP 126/88  Pulse 73  Temp(Src) 36.6 C (Oral)  Resp 17  Ht 5\' 10"  (1.778 m)  Wt 235 lb (106.595 kg)  BMI 33.72 kg/m2  SpO2 98%  Post vital signs: Reviewed  Level of consciousness: sedated  Complications: No apparent anesthesia complications

## 2012-11-10 ENCOUNTER — Encounter (HOSPITAL_COMMUNITY): Payer: Self-pay | Admitting: Urology

## 2013-06-05 ENCOUNTER — Ambulatory Visit (INDEPENDENT_AMBULATORY_CARE_PROVIDER_SITE_OTHER): Payer: BC Managed Care – PPO | Admitting: Family Medicine

## 2013-06-05 ENCOUNTER — Encounter: Payer: Self-pay | Admitting: Family Medicine

## 2013-06-05 VITALS — BP 140/92 | HR 92 | Ht 70.0 in | Wt 235.0 lb

## 2013-06-05 DIAGNOSIS — E669 Obesity, unspecified: Secondary | ICD-10-CM

## 2013-06-05 DIAGNOSIS — Z0000 Encounter for general adult medical examination without abnormal findings: Secondary | ICD-10-CM

## 2013-06-05 DIAGNOSIS — L723 Sebaceous cyst: Secondary | ICD-10-CM

## 2013-06-05 DIAGNOSIS — IMO0001 Reserved for inherently not codable concepts without codable children: Secondary | ICD-10-CM

## 2013-06-05 DIAGNOSIS — R030 Elevated blood-pressure reading, without diagnosis of hypertension: Secondary | ICD-10-CM

## 2013-06-05 LAB — CBC WITH DIFFERENTIAL/PLATELET
BASOS ABS: 0 10*3/uL (ref 0.0–0.1)
Basophils Relative: 0 % (ref 0–1)
EOS PCT: 2 % (ref 0–5)
Eosinophils Absolute: 0.2 10*3/uL (ref 0.0–0.7)
HEMATOCRIT: 50.6 % (ref 39.0–52.0)
Hemoglobin: 17.9 g/dL — ABNORMAL HIGH (ref 13.0–17.0)
LYMPHS ABS: 2.9 10*3/uL (ref 0.7–4.0)
LYMPHS PCT: 29 % (ref 12–46)
MCH: 30.7 pg (ref 26.0–34.0)
MCHC: 35.4 g/dL (ref 30.0–36.0)
MCV: 86.8 fL (ref 78.0–100.0)
MONO ABS: 1.5 10*3/uL — AB (ref 0.1–1.0)
Monocytes Relative: 14 % — ABNORMAL HIGH (ref 3–12)
Neutro Abs: 5.7 10*3/uL (ref 1.7–7.7)
Neutrophils Relative %: 55 % (ref 43–77)
Platelets: 324 10*3/uL (ref 150–400)
RBC: 5.83 MIL/uL — ABNORMAL HIGH (ref 4.22–5.81)
RDW: 13.4 % (ref 11.5–15.5)
WBC: 10.3 10*3/uL (ref 4.0–10.5)

## 2013-06-05 LAB — HEMOCCULT GUIAC POC 1CARD (OFFICE)

## 2013-06-05 NOTE — Patient Instructions (Signed)
Try to work up to 150 minutes a week of something physical. In terms of foods; cut back on white food. Bread, rice, pasta potatoes and sugar

## 2013-06-05 NOTE — Progress Notes (Signed)
Subjective:    Patient ID: Frank Orozco, male    DOB: [birth year], 43 y.o.   MRN: [id number]  HPI He is here for complete examination. He does have lesions on his head that he would like evaluated. They have not changed over the last several years. He recently has made some dietary changes and has lost roughly 8 pounds. He has no other concerns or complaints. His work and home life are going quite well. He does have a 70-year-old child at home.   Review of Systems  Constitutional: Negative.   HENT: Negative.   Eyes: Negative.   Respiratory: Negative.   Cardiovascular: Negative.   Gastrointestinal: Negative.   Endocrine: Negative.   Genitourinary: Negative.   Allergic/Immunologic: Negative.   Neurological: Negative.   Hematological: Negative.   Psychiatric/Behavioral: Negative.        Objective:   Physical Exam BP 140/92  Pulse 92  Ht 5' 10"  (1.778 m)  Wt 235 lb (106.595 kg)  BMI 33.72 kg/m2  SpO2 98%  General Appearance:    Alert, cooperative, no distress, appears stated age  Head:    Normocephalic, without obvious abnormality, atraumatic. Two round smooth movable lesions approximately 1-1/2 cm in size are noted on the posterior scalp area.   Eyes:    PERRL, conjunctiva/corneas clear, EOM's intact, fundi    benign  Ears:    Normal TM's and external ear canals  Nose:   Nares normal, mucosa normal, no drainage or sinus   tenderness  Throat:   Lips, mucosa, and tongue normal; teeth and gums normal  Neck:   Supple, no lymphadenopathy;  thyroid:  no   enlargement/tenderness/nodules; no carotid   bruit or JVD  Back:    Spine nontender, no curvature, ROM normal, no CVA     tenderness  Lungs:     Clear to auscultation bilaterally without wheezes, rales or     ronchi; respirations unlabored  Chest Wall:    No tenderness or deformity   Heart:    Regular rate and rhythm, S1 and S2 normal, no murmur, rub   or gallop  Breast Exam:    No chest wall tenderness, masses or gynecomastia   Abdomen:     Soft, non-tender, nondistended, normoactive bowel sounds,    no masses, no hepatosplenomegaly  Genitalia:    Normal male external genitalia without lesions.  Testicles without masses.  No inguinal hernias.  Rectal:    Normal sphincter tone, no masses or tenderness; guaiac negative stool.  Prostate smooth, no nodules, not enlarged.  Extremities:   No clubbing, cyanosis or edema  Pulses:   2+ and symmetric all extremities  Skin:   Skin color, texture, turgor normal, no rashes or lesions  Lymph nodes:   Cervical, supraclavicular, and axillary nodes normal  Neurologic:   CNII-XII intact, normal strength, sensation and gait; reflexes 2+ and symmetric throughout          Psych:   Normal mood, affect, hygiene and grooming.          Assessment & Plan:  Routine general medical examination at a health care facility - Plan: Comprehensive metabolic panel, Lipid panel, CBC with Differential, Hemoccult - 1 Card (office)  Sebaceous cyst  Obesity (BMI 30-39.9)  Elevated blood pressure  discussed lifestyle modification in regard to diet and exercise. Recommended going for smaller increments of exercise especially at work. Discuss cutting back on high carbohydrate foods specifically white foods. Explained that nothing needs to be done concerning the system  was to become infected. He was comfortable with that.

## 2013-06-06 LAB — COMPREHENSIVE METABOLIC PANEL
ALBUMIN: 4.7 g/dL (ref 3.5–5.2)
ALT: 67 U/L — ABNORMAL HIGH (ref 0–53)
AST: 30 U/L (ref 0–37)
Alkaline Phosphatase: 49 U/L (ref 39–117)
BUN: 18 mg/dL (ref 6–23)
CALCIUM: 10.2 mg/dL (ref 8.4–10.5)
CHLORIDE: 99 meq/L (ref 96–112)
CO2: 27 meq/L (ref 19–32)
Creat: 0.95 mg/dL (ref 0.50–1.35)
GLUCOSE: 85 mg/dL (ref 70–99)
POTASSIUM: 4.1 meq/L (ref 3.5–5.3)
Sodium: 138 mEq/L (ref 135–145)
Total Bilirubin: 1 mg/dL (ref 0.2–1.2)
Total Protein: 7.9 g/dL (ref 6.0–8.3)

## 2013-06-06 LAB — LIPID PANEL
CHOLESTEROL: 145 mg/dL (ref 0–200)
HDL: 51 mg/dL (ref >39)
LDL Cholesterol: 61 mg/dL (ref 0–99)
Total CHOL/HDL Ratio: 2.8 Ratio
Triglycerides: 166 mg/dL — ABNORMAL HIGH (ref <150)
VLDL: 33 mg/dL (ref 0–40)

## 2013-08-14 ENCOUNTER — Emergency Department (HOSPITAL_COMMUNITY)
Admission: EM | Admit: 2013-08-14 | Discharge: 2013-08-14 | Disposition: A | Discharge status: Home / Self Care | Payer: BC Managed Care – PPO | Attending: Emergency Medicine | Admitting: Emergency Medicine

## 2013-08-14 ENCOUNTER — Encounter (HOSPITAL_COMMUNITY): Payer: Self-pay | Admitting: Emergency Medicine

## 2013-08-14 ENCOUNTER — Emergency Department (HOSPITAL_COMMUNITY): Payer: BC Managed Care – PPO

## 2013-08-14 DIAGNOSIS — R109 Unspecified abdominal pain: Secondary | ICD-10-CM | POA: Insufficient documentation

## 2013-08-14 DIAGNOSIS — R112 Nausea with vomiting, unspecified: Secondary | ICD-10-CM | POA: Insufficient documentation

## 2013-08-14 LAB — URINALYSIS, ROUTINE W REFLEX MICROSCOPIC
Bilirubin Urine: NEGATIVE
GLUCOSE, UA: NEGATIVE mg/dL
HGB URINE DIPSTICK: NEGATIVE
Ketones, ur: NEGATIVE mg/dL
Leukocytes, UA: NEGATIVE
Nitrite: NEGATIVE
PROTEIN: NEGATIVE mg/dL
Specific Gravity, Urine: 1.035 — ABNORMAL HIGH (ref 1.005–1.030)
Urobilinogen, UA: 0.2 mg/dL (ref 0.0–1.0)
pH: 5.5 (ref 5.0–8.0)

## 2013-08-14 LAB — CBC WITH DIFFERENTIAL/PLATELET
BASOS ABS: 0 10*3/uL (ref 0.0–0.1)
Basophils Relative: 0 % (ref 0–1)
Eosinophils Absolute: 0.1 10*3/uL (ref 0.0–0.7)
Eosinophils Relative: 1 % (ref 0–5)
HCT: 49.4 % (ref 39.0–52.0)
Hemoglobin: 17.8 g/dL — ABNORMAL HIGH (ref 13.0–17.0)
LYMPHS PCT: 17 % (ref 12–46)
Lymphs Abs: 3.1 10*3/uL (ref 0.7–4.0)
MCH: 31.1 pg (ref 26.0–34.0)
MCHC: 36 g/dL (ref 30.0–36.0)
MCV: 86.4 fL (ref 78.0–100.0)
Monocytes Absolute: 1.1 10*3/uL — ABNORMAL HIGH (ref 0.1–1.0)
Monocytes Relative: 6 % (ref 3–12)
NEUTROS ABS: 13.8 10*3/uL — AB (ref 1.7–7.7)
Neutrophils Relative %: 76 % (ref 43–77)
PLATELETS: 303 10*3/uL (ref 150–400)
RBC: 5.72 MIL/uL (ref 4.22–5.81)
RDW: 12.3 % (ref 11.5–15.5)
WBC: 18.2 10*3/uL — AB (ref 4.0–10.5)

## 2013-08-14 LAB — COMPREHENSIVE METABOLIC PANEL
ALT: 72 U/L — AB (ref 0–53)
AST: 34 U/L (ref 0–37)
Albumin: 4.4 g/dL (ref 3.5–5.2)
Alkaline Phosphatase: 46 U/L (ref 39–117)
BILIRUBIN TOTAL: 1.1 mg/dL (ref 0.3–1.2)
BUN: 15 mg/dL (ref 6–23)
CALCIUM: 9.3 mg/dL (ref 8.4–10.5)
CHLORIDE: 101 meq/L (ref 96–112)
CO2: 24 meq/L (ref 19–32)
Creatinine, Ser: 0.82 mg/dL (ref 0.50–1.35)
GFR calc Af Amer: >90 mL/min (ref >90)
Glucose, Bld: 94 mg/dL (ref 70–99)
Potassium: 3.9 mEq/L (ref 3.7–5.3)
SODIUM: 139 meq/L (ref 137–147)
Total Protein: 7.8 g/dL (ref 6.0–8.3)

## 2013-08-14 LAB — LIPASE, BLOOD: Lipase: 25 U/L (ref 11–59)

## 2013-08-14 MED ORDER — IOHEXOL 300 MG/ML  SOLN
100.0000 mL | Freq: Once | INTRAMUSCULAR | Status: AC | PRN
  Administered 2013-08-14: 100 mL via INTRAVENOUS

## 2013-08-14 MED ORDER — MORPHINE SULFATE 4 MG/ML IJ SOLN
6.0000 mg | Freq: Once | INTRAMUSCULAR | Status: AC
  Administered 2013-08-14: 6 mg via INTRAVENOUS
  Filled 2013-08-14: qty 2

## 2013-08-14 MED ORDER — OXYCODONE-ACETAMINOPHEN 5-325 MG PO TABS: 1.0000 | ORAL_TABLET | ORAL | Status: DC | PRN

## 2013-08-14 MED ORDER — IOHEXOL 300 MG/ML  SOLN
50.0000 mL | Freq: Once | INTRAMUSCULAR | Status: AC | PRN
  Administered 2013-08-14: 50 mL via ORAL

## 2013-08-14 MED ORDER — ONDANSETRON HCL 4 MG PO TABS: 8.0000 mg | ORAL_TABLET | Freq: Three times a day (TID) | ORAL | Status: DC | PRN

## 2013-08-14 MED ORDER — ONDANSETRON HCL 4 MG/2ML IJ SOLN
4.0000 mg | Freq: Once | INTRAMUSCULAR | Status: AC
  Administered 2013-08-14: 4 mg via INTRAVENOUS
  Filled 2013-08-14: qty 2

## 2013-08-14 NOTE — ED Notes (Signed)
Pt c/o n/v after given 44m of Morphine. Zofran 434mgiven.

## 2013-08-14 NOTE — ED Notes (Signed)
Pt is unable to urinate at this time.

## 2013-08-14 NOTE — ED Notes (Signed)
Pt complains of abd pain since midnight, worse since he came home from work, no vomiting or diarrhea

## 2013-08-14 NOTE — ED Provider Notes (Addendum)
CSN: [id number]     Arrival date & time 08/14/13  0254 History   First MD Initiated Contact with Patient 08/14/13 0403     Chief Complaint  Patient presents with  . Abdominal Pain      HPI Patient presents to the emergency department with abdominal pain that began rather abruptly initially started in his left lower abdomen but has not moved his right abdomen.  No fevers or chills.  Developed nausea and vomiting the emergency apartment.  No diarrhea.  Pain is constant.  No urinary complaints.   Past Medical History  Diagnosis Date  . Erectile dysfunction    Past Surgical History  Procedure Laterality Date  . Wisdom tooth extraction  2010  . Vasectomy Bilateral 11/09/2012    Procedure: BILATERAL VASECTOMY;  Surgeon: Molli Hazard, MD;  Location: [facility];  Service: Urology;  Laterality: Bilateral;  SPERMATIC CORD BLOCK     Family History  Problem Relation Age of Onset  . Arthritis Mother    History  Substance Use Topics  . Smoking status: Never Smoker   . Smokeless tobacco: Never Used  . Alcohol Use: Yes     Comment: 3 drinks a month    Review of Systems  All other systems reviewed and are negative.     Allergies  Review of patient's allergies indicates no known allergies.  Home Medications   Current Outpatient Rx  Name  Route  Sig  Dispense  Refill  . ibuprofen (ADVIL,MOTRIN) 200 MG tablet   Oral   Take 400 mg by mouth every 6 (six) hours as needed (pain).         . ondansetron (ZOFRAN) 4 MG tablet   Oral   Take 2 tablets (8 mg total) by mouth every 8 (eight) hours as needed for nausea or vomiting.   15 tablet   0   . oxyCODONE-acetaminophen (PERCOCET/ROXICET) 5-325 MG per tablet   Oral   Take 1 tablet by mouth every 4 (four) hours as needed for severe pain.   20 tablet   0    BP 133/85  Pulse 80  Temp(Src) 98.3 F (36.8 C) (Oral)  Resp 18  Ht 5' 10"  (1.778 m)  Wt 235 lb (106.595 kg)  BMI 33.72 kg/m2  SpO2 96% Physical Exam  Nursing  note and vitals reviewed. Constitutional: He is oriented to person, place, and time. He appears well-developed and well-nourished.  HENT:  Head: Normocephalic and atraumatic.  Eyes: EOM are normal.  Neck: Normal range of motion.  Cardiovascular: Normal rate, regular rhythm, normal heart sounds and intact distal pulses.   Pulmonary/Chest: Effort normal and breath sounds normal. No respiratory distress.  Abdominal: Soft. He exhibits no distension.  Pt with right sided abdominal tenderness without guarding or rebound  Musculoskeletal: Normal range of motion.  Neurological: He is alert and oriented to person, place, and time.  Skin: Skin is warm and dry.  Psychiatric: He has a normal mood and affect. Judgment normal.    ED Course  Procedures (including critical care time) Labs Review Labs Reviewed  CBC WITH DIFFERENTIAL - Abnormal; Notable for the following:    WBC 18.2 (*)    Hemoglobin 17.8 (*)    Neutro Abs 13.8 (*)    Monocytes Absolute 1.1 (*)    All other components within normal limits  COMPREHENSIVE METABOLIC PANEL - Abnormal; Notable for the following:    ALT 72 (*)    All other components within normal limits  URINALYSIS, ROUTINE  W REFLEX MICROSCOPIC - Abnormal; Notable for the following:    Specific Gravity, Urine 1.035 (*)    All other components within normal limits  LIPASE, BLOOD   Imaging Review Ct Abdomen Pelvis W Contrast  08/14/2013   CLINICAL DATA:  Right lower quadrant pain  EXAM: CT ABDOMEN AND PELVIS WITH CONTRAST  TECHNIQUE: Multidetector CT imaging of the abdomen and pelvis was performed using the standard protocol following bolus administration of intravenous contrast.  CONTRAST:  113m OMNIPAQUE IOHEXOL 300 MG/ML  SOLN  COMPARISON:  CT ABD W/CM dated 06/05/2008  FINDINGS: The lung bases are clear.  The liver demonstrates no focal abnormality. There is no intrahepatic or extrahepatic biliary ductal dilatation. The gallbladder is normal. The spleen demonstrates  no focal abnormality. The kidneys, adrenal glands and pancreas are normal. The bladder is unremarkable.  The stomach, duodenum, small intestine, and large intestine demonstrate no contrast extravasation or dilatation. There is a normal caliber appendix in the right lower quadrant without periappendiceal inflammatory changes. There is no pneumoperitoneum, pneumatosis, or portal venous gas. There is no abdominal or pelvic free fluid. There is no lymphadenopathy.  The abdominal aorta is normal in caliber.  There are no lytic or sclerotic osseous lesions.  IMPRESSION: No acute abdominal or pelvic pathology.   Electronically Signed   By: HKathreen Devoid  On: 08/14/2013 06:12  I personally reviewed the imaging tests through PACS system I reviewed available ER/hospitalization records through the EMR    EKG Interpretation None      MDM   Final diagnoses:  Abdominal pain    Patient leukocytosis and some right-sided abdominal pain however a CT scan was performed and demonstrates a normal-appearing appendix without any inflammatory changes or surrounding stranding.  The patient's pain has been managed in the emergency department.  His nausea and vomiting his been treated.  His pain continues somewhat at this time but he seems to be feeling better.  I've asked that the patient return to the emergency department in 20 hours for recheck.  He understands to return back to the emergency apartment sooner for new or worsening symptoms.     KHoy Morn MD 08/14/13 0[place]terling MD 08/14/13 [phone]

## 2013-08-31 ENCOUNTER — Ambulatory Visit (INDEPENDENT_AMBULATORY_CARE_PROVIDER_SITE_OTHER): Payer: BC Managed Care – PPO | Admitting: Medical

## 2013-08-31 ENCOUNTER — Encounter: Payer: Self-pay | Admitting: Medical

## 2013-08-31 VITALS — BP 120/80 | HR 60 | Temp 97.7°F | Resp 15 | Wt 230.0 lb

## 2013-08-31 DIAGNOSIS — L600 Ingrowing nail: Secondary | ICD-10-CM | POA: Diagnosis not present

## 2013-08-31 NOTE — Progress Notes (Signed)
Subjective:   HPI Here for c/o ingrown toenail. Left great toenail with redness, pain, swelling x 3-4 days, pus drainage.   He had similar with toenail procedure in years past.   He does cut rounded on toenails. No other aggravating or relieving factors. No other complaint.   Review of Systems Constitutional: denies fever, chills, sweats, Musculoskeletal: denies arthralgias, myalgias Neurology: no tingling, numbness    Objective:   Physical Exam  General appearance: alert, no distress, WD/WN, male, cooperative  Extremities: left lateral nailbed of great left toe with erythema, swelling, tender, slight amount of discharge laterally at the toenail, and ingrowing, tender to touch. Otherwise foot exam normal Pulses:  normal cap refill Neurological: Sensation of great toes normal, strength normal    Assessment & Plan:    Encounter Diagnosis  Name Primary?  . Ingrown toenail Yes   Discussed options for therapy.  He wants to have excision/procedure to relieve the pressure of the toenail.  Risks/benefits explained, he consents.   Procedure: Cleaned and prepped in usual sterile fashion, used 4cc of 1% lidocaine without epinephrine for a digital block of the left great toe, used a hemostat and scissors to remove the lateral fifth of the nail, irrigated with high-pressure saline, covered with sterile bandage. Patient tolerated procedure well, less than 2cc blood loss.  Advised they leave bandage on for 2-3 days, then can use Epsom salt soaks, keep wound clean and dry.  Discussed proper nail cutting for fingers and toes.  Discussed signs of infection, and they will call if worse or not improving.   Follow up 10-14 days, sooner prn.

## 2013-09-27 ENCOUNTER — Encounter: Payer: Self-pay | Admitting: Family Medicine

## 2013-09-27 ENCOUNTER — Ambulatory Visit (INDEPENDENT_AMBULATORY_CARE_PROVIDER_SITE_OTHER): Payer: BC Managed Care – PPO | Admitting: Family Medicine

## 2013-09-27 VITALS — BP 110/80 | HR 64 | Temp 97.9°F | Ht 70.0 in | Wt 232.0 lb

## 2013-09-27 DIAGNOSIS — T6391XA Toxic effect of contact with unspecified venomous animal, accidental (unintentional), initial encounter: Secondary | ICD-10-CM

## 2013-09-27 DIAGNOSIS — IMO0001 Reserved for inherently not codable concepts without codable children: Secondary | ICD-10-CM

## 2013-09-27 DIAGNOSIS — IMO0002 Reserved for concepts with insufficient information to code with codable children: Secondary | ICD-10-CM

## 2013-09-27 NOTE — Patient Instructions (Signed)
Bee, Wasp, or Hornet Sting Your caregiver has diagnosed you as having an insect sting. An insect sting appears as a red lump in the skin that sometimes has a tiny hole in the center, or it may have a stinger in the center of the wound. The most common stings are from wasps, hornets and bees. Individuals have different reactions to insect stings.  A normal reaction may cause pain, swelling, and redness around the sting site.  A localized allergic reaction may cause swelling and redness that extends beyond the sting site.  A large local reaction may continue to develop over the next 12 to 36 hours.  On occasion, the reactions can be severe (anaphylactic reaction). An anaphylactic reaction may cause wheezing; difficulty breathing; chest pain; fainting; raised, itchy, red patches on the skin; a sick feeling to your stomach (nausea); vomiting; cramping; or diarrhea. If you have had an anaphylactic reaction to an insect sting in the past, you are more likely to have one again. HOME CARE INSTRUCTIONS   With bee stings, a small sac of poison is left in the wound. Brushing across this with something such as a credit card, or anything similar, will help remove this and decrease the amount of the reaction. This same procedure will not help a wasp sting as they do not leave behind a stinger and poison sac.  Apply a cold compress for 10 to 20 minutes every hour for 1 to 2 days, depending on severity, to reduce swelling and itching.  To lessen pain, a paste made of water and baking soda may be rubbed on the bite or sting and left on for 5 minutes.  To relieve itching and swelling, you may use take medication or apply medicated creams or lotions as directed.  Only take over-the-counter or prescription medicines for pain, discomfort, or fever as directed by your caregiver.  Wash the sting site daily with soap and water. Apply antibiotic ointment on the sting site as directed.  If you suffered a severe  reaction:  If you did not require hospitalization, an adult will need to stay with you for 24 hours in case the symptoms return.  You may need to wear a medical bracelet or necklace stating the allergy.  You and your family need to learn when and how to use an anaphylaxis kit or epinephrine injection.  If you have had a severe reaction before, always carry your anaphylaxis kit with you. SEEK MEDICAL CARE IF:   None of the above helps within 2 to 3 days.  The area becomes red, warm, tender, and swollen beyond the area of the bite or sting.  You have an oral temperature above 102 F (38.9 C). SEEK IMMEDIATE MEDICAL CARE IF:  You have symptoms of an allergic reaction which are:  Wheezing.  Difficulty breathing.  Chest pain.  Lightheadedness or fainting.  Itchy, raised, red patches on the skin.  Nausea, vomiting, cramping or diarrhea. ANY OF THESE SYMPTOMS MAY REPRESENT A SERIOUS PROBLEM THAT IS AN EMERGENCY. Do not wait to see if the symptoms will go away. Get medical help right away. Call your local emergency services (911 in [place] DO NOT drive yourself to the hospital. MAKE SURE YOU:   Understand these instructions.  Will watch your condition.  Will get help right away if you are not doing well or get worse. Document Released: 04/20/2005 Document Revised: 07/13/2011 Document Reviewed: 10/05/2009 [employer] Patient Information 2014 [employer].  

## 2013-09-27 NOTE — Progress Notes (Signed)
Chief Complaint  Patient presents with  . Insect Bite    was mowing yard this past Sunday, saw a wasp, ran it over and thinks it ma y have stung his right ankle. Ankle is now swollen and blisters.    Ran over a hornet or wasp with the mower 3 days ago, and shortly after that he was stung on the right ankle.  It felt like a bee sting.  It didn't swell immediately.  Over Monday and Tuesday he noticed some swelling, which has increased in size.  Feels a burning discomfort, no itching.  No medication or topical treatments used for this. His wife is worried and made him come today to get it checked out  Past Medical History  Diagnosis Date  . Erectile dysfunction    Past Surgical History  Procedure Laterality Date  . Wisdom tooth extraction  2010  . Vasectomy Bilateral 11/09/2012    Procedure: BILATERAL VASECTOMY;  Surgeon: Molli Hazard, MD;  Location: [facility];  Service: Urology;  Laterality: Bilateral;  SPERMATIC CORD BLOCK     History   Social History  . Marital Status: Married    Spouse Name: N/A    Number of Children: N/A  . Years of Education: N/A   Occupational History  . Not on file.   Social History Main Topics  . Smoking status: Never Smoker   . Smokeless tobacco: Never Used  . Alcohol Use: Yes     Comment: 3 drinks a month  . Drug Use: No  . Sexual Activity: Yes     Comment: Conservation officer, nature   Other Topics Concern  . Not on file   Social History Narrative  . No narrative on file   Meds: none No Known Allergies  ROS: Denies fevers, chills, shortness of breath, rashes elsewhere or other concerns. No chest pain, nausea,vomiting, diarrhea, headaches, dizziness  PHYSICAL EXAM: BP 110/80  Pulse 64  Temp(Src) 97.9 F (36.6 C) (Oral)  Ht 5' 10"  (1.778 m)  Wt 232 lb (105.235 kg)  BMI 33.29 kg/m2 Well developed,pleasant male in no distress  R anterior ankle: Soft tissue swelling with a 9 x 6 cm area of very mild erythema.  Centrally within this area is a  cluster of 69m or less vesicles.  There are no red streaks.  There is slight violaceous/pink color on the top of the foot, but there seems to be a clear demaracation of redness/swelling above this area. Normal pulses, brisk capillary refill Remainder of skin is normal  ASSESSMENT/PLAN: Sting of skin - R ankle; date of injury 09/24/13  Sting with local reaction.  No evidence of cellulitis at this point.  Conservative measures reviewed--OTC hydrocortisone cream BID, ice.  Reviewed s/sx of infection or worsening reaction, and to return for re-eval for any significant changes.

## 2013-12-04 ENCOUNTER — Encounter: Payer: Self-pay | Admitting: Family Medicine

## 2013-12-04 ENCOUNTER — Ambulatory Visit (INDEPENDENT_AMBULATORY_CARE_PROVIDER_SITE_OTHER): Payer: BC Managed Care – PPO | Admitting: Family Medicine

## 2013-12-04 VITALS — BP 118/78 | HR 83 | Wt 235.0 lb

## 2013-12-04 DIAGNOSIS — D72829 Elevated white blood cell count, unspecified: Secondary | ICD-10-CM

## 2013-12-04 DIAGNOSIS — Z13220 Encounter for screening for lipoid disorders: Secondary | ICD-10-CM

## 2013-12-04 DIAGNOSIS — L723 Sebaceous cyst: Secondary | ICD-10-CM

## 2013-12-04 LAB — CBC WITH DIFFERENTIAL/PLATELET
BASOS ABS: 0 10*3/uL (ref 0.0–0.1)
Basophils Relative: 0 % (ref 0–1)
EOS PCT: 2 % (ref 0–5)
Eosinophils Absolute: 0.2 10*3/uL (ref 0.0–0.7)
HEMATOCRIT: 48.1 % (ref 39.0–52.0)
HEMOGLOBIN: 16.7 g/dL (ref 13.0–17.0)
LYMPHS ABS: 3.1 10*3/uL (ref 0.7–4.0)
LYMPHS PCT: 34 % (ref 12–46)
MCH: 30.6 pg (ref 26.0–34.0)
MCHC: 34.7 g/dL (ref 30.0–36.0)
MCV: 88.3 fL (ref 78.0–100.0)
MONO ABS: 0.6 10*3/uL (ref 0.1–1.0)
MONOS PCT: 7 % (ref 3–12)
Neutro Abs: 5.2 10*3/uL (ref 1.7–7.7)
Neutrophils Relative %: 57 % (ref 43–77)
Platelets: 309 10*3/uL (ref 150–400)
RBC: 5.45 MIL/uL (ref 4.22–5.81)
RDW: 13.6 % (ref 11.5–15.5)
WBC: 9.1 10*3/uL (ref 4.0–10.5)

## 2013-12-04 NOTE — Progress Notes (Signed)
   Subjective:    Patient ID: Frank Orozco, male    DOB: 07-03-1970, 43 y.o.   MRN: [id number]  HPI He is here for followup visit he was seen in the emergency room in April for evaluation of abdominal pain. The ER record was reviewed. He did show slightly elevated white blood count. He also has lesions on the occipital area of his scalp that are starting to bother him especially when he cuts his hair.   Review of Systems     Objective:   Physical Exam alert and in no distress. Tympanic membranes and canals are normal. Throat is clear. Tonsils are normal. Neck is supple without adenopathy or thyromegaly. Cardiac exam shows a regular sinus rhythm without murmurs or gallops. Lungs are clear to auscultation. Two 2cm lesions are noted on the exit he'll area of the scalp that are round smooth movable       Assessment & Plan:  Sebaceous cyst  Leukocytosis, unspecified - Plan: CBC with Differential  Screening cholesterol level - Plan: Lipid panel  recommend returning at his convenience for excision of the cysts.

## 2013-12-05 LAB — LIPID PANEL
CHOL/HDL RATIO: 3.5 ratio
Cholesterol: 166 mg/dL (ref 0–200)
HDL: 48 mg/dL (ref >39)
LDL CALC: 62 mg/dL (ref 0–99)
Triglycerides: 278 mg/dL — ABNORMAL HIGH (ref <150)
VLDL: 56 mg/dL — AB (ref 0–40)

## 2013-12-19 ENCOUNTER — Ambulatory Visit: Payer: BC Managed Care – PPO | Admitting: Family Medicine

## 2013-12-22 ENCOUNTER — Encounter: Payer: Self-pay | Admitting: Family Medicine

## 2013-12-22 ENCOUNTER — Ambulatory Visit (INDEPENDENT_AMBULATORY_CARE_PROVIDER_SITE_OTHER): Payer: BC Managed Care – PPO | Admitting: Family Medicine

## 2013-12-22 VITALS — BP 100/70 | HR 66 | Wt 234.0 lb

## 2013-12-22 DIAGNOSIS — L723 Sebaceous cyst: Secondary | ICD-10-CM

## 2013-12-22 MED ORDER — LIDOCAINE-EPINEPHRINE 2 %-1:100000 IJ SOLN
5.0000 mL | Freq: Once | INTRAMUSCULAR | Status: AC
  Administered 2013-12-22: 5 mL via INTRADERMAL

## 2013-12-22 NOTE — Progress Notes (Signed)
   Subjective:    Patient ID: Frank Orozco, male    DOB: 27-Jan-1971, 43 y.o.   MRN: [id number]  HPI He is here for excision of 2 sebaceous cyst in the occipital area. They're giving him difficulty when he gets his hair cut it also combs his hair.   Review of Systems     Objective:   Physical Exam 2 cysts approximately 1-1/2-2 cm in size are noted in the occipital area of his scalp.       Assessment & Plan:  Sebaceous cyst  the lesions were injected with Xylocaine and epinephrine. A 1-1/2 cm incision was made in both of the lesions and the cyst was removed in toto. Pressure was applied to ensure adequate hemostasis the

## 2014-01-12 ENCOUNTER — Encounter: Payer: Self-pay | Admitting: Family Medicine

## 2014-01-12 ENCOUNTER — Ambulatory Visit (INDEPENDENT_AMBULATORY_CARE_PROVIDER_SITE_OTHER): Payer: BC Managed Care – PPO | Admitting: Family Medicine

## 2014-01-12 VITALS — BP 120/80 | HR 89 | Wt 233.0 lb

## 2014-01-12 DIAGNOSIS — T148XXA Other injury of unspecified body region, initial encounter: Secondary | ICD-10-CM

## 2014-01-12 DIAGNOSIS — T798XXA Other early complications of trauma, initial encounter: Secondary | ICD-10-CM

## 2014-01-12 DIAGNOSIS — L089 Local infection of the skin and subcutaneous tissue, unspecified: Secondary | ICD-10-CM

## 2014-01-13 NOTE — Progress Notes (Signed)
   Subjective:    Patient ID: Frank Orozco, male    DOB: 11-16-1970, 43 y.o.   MRN: [id number]  HPI On August 21 he had 2 sebaceous cyst removed from the scalp area. He now states he is noted some slight bloody drainage from a wound in the occipital area. It is slightly tender to palpation.   Review of Systems     Objective:   Physical Exam A 1-1/2 cm fluctuant erythematous lesion is noted in the occipital area.       Assessment & Plan:  Infected wound, initial encounter  the area was injected with Xylocaine. An X. type incision was made. Minimal amount of drainage was noted. The wound was explored and cleaned with Betadine. He is to keep the area clean and use an antibiotic ointment on this as well as pressure dressing. He will return here further difficulty.

## 2015-04-04 ENCOUNTER — Encounter: Payer: Self-pay | Admitting: Family Medicine

## 2015-04-04 ENCOUNTER — Ambulatory Visit (INDEPENDENT_AMBULATORY_CARE_PROVIDER_SITE_OTHER): Payer: BLUE CROSS/BLUE SHIELD | Admitting: Family Medicine

## 2015-04-04 VITALS — BP 121/95 | HR 81 | Ht 70.0 in | Wt 235.0 lb

## 2015-04-04 DIAGNOSIS — IMO0001 Reserved for inherently not codable concepts without codable children: Secondary | ICD-10-CM

## 2015-04-04 DIAGNOSIS — R7301 Impaired fasting glucose: Secondary | ICD-10-CM

## 2015-04-04 DIAGNOSIS — R202 Paresthesia of skin: Secondary | ICD-10-CM | POA: Insufficient documentation

## 2015-04-04 DIAGNOSIS — R030 Elevated blood-pressure reading, without diagnosis of hypertension: Secondary | ICD-10-CM

## 2015-04-04 DIAGNOSIS — E559 Vitamin D deficiency, unspecified: Secondary | ICD-10-CM

## 2015-04-04 DIAGNOSIS — Z23 Encounter for immunization: Secondary | ICD-10-CM

## 2015-04-04 DIAGNOSIS — R635 Abnormal weight gain: Secondary | ICD-10-CM

## 2015-04-04 DIAGNOSIS — E669 Obesity, unspecified: Secondary | ICD-10-CM

## 2015-04-04 MED ORDER — PHENTERMINE HCL 37.5 MG PO TABS: ORAL_TABLET | ORAL | Status: DC

## 2015-04-04 NOTE — Assessment & Plan Note (Signed)
Probably related to L3 lumbar radiculopathy or irritation. 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 He certainly has the body habitus for meralgia paresthetica. Plan for lumbar x-ray and recheck in one month. Patient declined gabapentin.

## 2015-04-04 NOTE — Assessment & Plan Note (Signed)
Check fasting labs

## 2015-04-04 NOTE — Assessment & Plan Note (Signed)
Discussed options. Plan for phentermine. Discussed risks and benefits of this medication. Recheck next month.

## 2015-04-04 NOTE — Patient Instructions (Signed)
Thank you for coming in today. Call or go to the emergency room if you get worse, have trouble breathing, have chest pains, or palpitations.   Start phentermine.  Return in 1 month.   Phentermine tablets or capsules What is this medicine? PHENTERMINE (FEN ter meen) decreases your appetite. It is used with a reduced calorie diet and exercise to help you lose weight. This medicine may be used for other purposes; ask your health care provider or pharmacist if you have questions. What should I tell my health care provider before I take this medicine? They need to know if you have any of these conditions: -agitation -glaucoma -heart disease -high blood pressure -history of substance abuse -lung disease called Primary Pulmonary Hypertension (PPH) -taken an MAOI like Carbex, Eldepryl, Marplan, Nardil, or Parnate in last 14 days -thyroid disease -an unusual or allergic reaction to phentermine, other medicines, foods, dyes, or preservatives -pregnant or trying to get pregnant -breast-feeding How should I use this medicine? Take this medicine by mouth with a glass of water. Follow the directions on the prescription label. This medicine is usually taken 30 minutes before or 1 to 2 hours after breakfast. Avoid taking this medicine in the evening. It may interfere with sleep. Take your doses at regular intervals. Do not take your medicine more often than directed. Talk to your pediatrician regarding the use of this medicine in children. Special care may be needed. Overdosage: If you think you have taken too much of this medicine contact a poison control center or emergency room at once. NOTE: This medicine is only for you. Do not share this medicine with others. What if I miss a dose? If you miss a dose, take it as soon as you can. If it is almost time for your next dose, take only that dose. Do not take double or extra doses. What may interact with this medicine? Do not take this medicine with any  of the following medications: -duloxetine -MAOIs like Carbex, Eldepryl, Marplan, Nardil, and Parnate -medicines for colds or breathing difficulties like pseudoephedrine or phenylephrine -procarbazine -sibutramine -SSRIs like citalopram, escitalopram, fluoxetine, fluvoxamine, paroxetine, and sertraline -stimulants like dexmethylphenidate, methylphenidate or modafinil -venlafaxine This medicine may also interact with the following medications: -medicines for diabetes This list may not describe all possible interactions. Give your health care provider a list of all the medicines, herbs, non-prescription drugs, or dietary supplements you use. Also tell them if you smoke, drink alcohol, or use illegal drugs. Some items may interact with your medicine. What should I watch for while using this medicine? Notify your physician immediately if you become short of breath while doing your normal activities. Do not take this medicine within 6 hours of bedtime. It can keep you from getting to sleep. Avoid drinks that contain caffeine and try to stick to a regular bedtime every night. This medicine was intended to be used in addition to a healthy diet and exercise. The best results are achieved this way. This medicine is only indicated for short-term use. Eventually your weight loss may level out. At that point, the drug will only help you maintain your new weight. Do not increase or in any way change your dose without consulting your doctor. You may get drowsy or dizzy. Do not drive, use machinery, or do anything that needs mental alertness until you know how this medicine affects you. Do not stand or sit up quickly, especially if you are an older patient. This reduces the risk of dizzy or  fainting spells. Alcohol may increase dizziness and drowsiness. Avoid alcoholic drinks. What side effects may I notice from receiving this medicine? Side effects that you should report to your doctor or health care professional  as soon as possible: -chest pain, palpitations -depression or severe changes in mood -increased blood pressure -irritability -nervousness or restlessness -severe dizziness -shortness of breath -problems urinating -unusual swelling of the legs -vomiting Side effects that usually do not require medical attention (report to your doctor or health care professional if they continue or are bothersome): -blurred vision or other eye problems -changes in sexual ability or desire -constipation or diarrhea -difficulty sleeping -dry mouth or unpleasant taste -headache -nausea This list may not describe all possible side effects. Call your doctor for medical advice about side effects. You may report side effects to FDA at 1-800-FDA-1088. Where should I keep my medicine? Keep out of the reach of children. This medicine can be abused. Keep your medicine in a safe place to protect it from theft. Do not share this medicine with anyone. Selling or giving away this medicine is dangerous and against the law. This medicine may cause accidental overdose and death if taken by other adults, children, or pets. Mix any unused medicine with a substance like cat litter or coffee grounds. Then throw the medicine away in a sealed container like a sealed bag or a coffee can with a lid. Do not use the medicine after the expiration date. Store at room temperature between 20 and 25 degrees C (68 and 77 degrees F). Keep container tightly closed. NOTE: This sheet is a summary. It may not cover all possible information. If you have questions about this medicine, talk to your doctor, pharmacist, or health care provider.    2016, Elsevier/Gold Standard. (2014-01-09 16:19:53)

## 2015-04-04 NOTE — Assessment & Plan Note (Signed)
Mildly elevated. Recheck next month on the phentermine if still elevated*blood pressure medications.

## 2015-04-04 NOTE — Progress Notes (Signed)
Frank Orozco is a 44 y.o. male who presents to [facility]: [facility]  today for establish care. Patient has 2 issues he like to discuss today.  1) patient notes numbness and tingling to the anterior thighs bilaterally. This is been present for 6 months. Symptoms worse with prolonged sitting at work. He denies any weakness or loss of function or pain. Numbness and tingling does not seem to be worse with flexion or extension or activity.  2) obesity and abnormal weight gain: Patient has gained weight over the last several years despite multiple attempts to lose weight with diet and exercise. His wife has had good success with phentermine and he would like to try some phentermine if possible today. He denies any palpitations chest pains or shortness of breath. No family or personal history of severe heart disease.   Past Medical History  Diagnosis Date  . Erectile dysfunction    Past Surgical History  Procedure Laterality Date  . Wisdom tooth extraction  2010  . Vasectomy Bilateral 11/09/2012    Procedure: BILATERAL VASECTOMY;  Surgeon: Molli Hazard, MD;  Location: [facility];  Service: Urology;  Laterality: Bilateral;  SPERMATIC CORD BLOCK     Social History  Substance Use Topics  . Smoking status: Never Smoker   . Smokeless tobacco: Never Used  . Alcohol Use: Yes     Comment: 3 drinks a month   family history includes Arthritis in his mother.  ROS as above Medications: Current Outpatient Prescriptions  Medication Sig Dispense Refill  . ibuprofen (ADVIL,MOTRIN) 200 MG tablet Take 400 mg by mouth every 6 (six) hours as needed (pain).    . phentermine (ADIPEX-P) 37.5 MG tablet One tab by mouth qAM 30 tablet 0   No current facility-administered medications for this visit.   No Known Allergies   Exam:  BP 121/95 mmHg  Pulse 81  Ht 5' 10"  (1.778 m)  Wt 235 lb (106.595 kg)  BMI 33.72 kg/m2 Gen: Well NAD obese HEENT: EOMI,  MMM Lungs: Normal work  of breathing. CTABL Heart: RRR no MRG Abd: NABS, Soft. Nondistended, Nontender obese abdomen Exts: Brisk capillary refill, warm and well perfused.  MSK: Back is nontender with normal motion. Hips are nontender with normal motion. Paresthesias is not reproducible with deep pressure overlying the lateral femoral cutaneous nerve at the anterior hip. Neuro: Alert and oriented normal speech and thought processes. Normal affect.   X-ray L-spine pending  No results found for this or any previous visit (from the past 24 hour(s)). No results found.   Please see individual assessment and plan sections.

## 2015-04-09 ENCOUNTER — Telehealth: Payer: Self-pay

## 2015-04-09 NOTE — Telephone Encounter (Signed)
Medical Records sent to [facility] on 04/09/15

## 2015-04-11 LAB — CBC
HCT: 49.2 % (ref 39.0–52.0)
Hemoglobin: 17.2 g/dL — ABNORMAL HIGH (ref 13.0–17.0)
MCH: 30.5 pg (ref 26.0–34.0)
MCHC: 35 g/dL (ref 30.0–36.0)
MCV: 87.2 fL (ref 78.0–100.0)
MPV: 9.1 fL (ref 8.6–12.4)
PLATELETS: 330 10*3/uL (ref 150–400)
RBC: 5.64 MIL/uL (ref 4.22–5.81)
RDW: 13.1 % (ref 11.5–15.5)
WBC: 9.7 10*3/uL (ref 4.0–10.5)

## 2015-04-12 DIAGNOSIS — E559 Vitamin D deficiency, unspecified: Secondary | ICD-10-CM | POA: Insufficient documentation

## 2015-04-12 DIAGNOSIS — R7301 Impaired fasting glucose: Secondary | ICD-10-CM | POA: Insufficient documentation

## 2015-04-12 LAB — COMPREHENSIVE METABOLIC PANEL
ALT: 54 U/L — ABNORMAL HIGH (ref 9–46)
AST: 27 U/L (ref 10–40)
Albumin: 4.6 g/dL (ref 3.6–5.1)
Alkaline Phosphatase: 41 U/L (ref 40–115)
BILIRUBIN TOTAL: 1.2 mg/dL (ref 0.2–1.2)
BUN: 12 mg/dL (ref 7–25)
CHLORIDE: 100 mmol/L (ref 98–110)
CO2: 29 mmol/L (ref 20–31)
CREATININE: 0.94 mg/dL (ref 0.60–1.35)
Calcium: 9.6 mg/dL (ref 8.6–10.3)
GLUCOSE: 110 mg/dL — AB (ref 65–99)
Potassium: 4.7 mmol/L (ref 3.5–5.3)
SODIUM: 140 mmol/L (ref 135–146)
Total Protein: 7.1 g/dL (ref 6.1–8.1)

## 2015-04-12 LAB — LIPID PANEL
Cholesterol: 121 mg/dL — ABNORMAL LOW (ref 125–200)
HDL: 48 mg/dL (ref >=40)
LDL CALC: 59 mg/dL (ref <130)
Total CHOL/HDL Ratio: 2.5 Ratio (ref <=5.0)
Triglycerides: 71 mg/dL (ref <150)
VLDL: 14 mg/dL (ref <30)

## 2015-04-12 LAB — TSH: TSH: 1.456 u[IU]/mL (ref 0.350–4.500)

## 2015-04-12 LAB — VITAMIN D 25 HYDROXY (VIT D DEFICIENCY, FRACTURES): VIT D 25 HYDROXY: 24 ng/mL — AB (ref 30–100)

## 2015-04-12 MED ORDER — VITAMIN D (ERGOCALCIFEROL) 1.25 MG (50000 UNIT) PO CAPS: 50000.0000 [IU] | ORAL_CAPSULE | ORAL | Status: DC

## 2015-04-12 NOTE — Addendum Note (Signed)
Addended by: Gregor Hams on: 04/12/2015 07:49 AM   Modules accepted: Orders

## 2015-04-12 NOTE — Progress Notes (Signed)
Quick Note:  Labs show low vit D. I sent in a Rx for Vit D.  They also showed prediabetes. Weight loss will help with this.  Return in 1-2 months. ______

## 2015-05-10 ENCOUNTER — Ambulatory Visit (INDEPENDENT_AMBULATORY_CARE_PROVIDER_SITE_OTHER): Payer: BLUE CROSS/BLUE SHIELD | Admitting: Family Medicine

## 2015-05-10 ENCOUNTER — Encounter: Payer: Self-pay | Admitting: Family Medicine

## 2015-05-10 VITALS — BP 141/96 | HR 79 | Wt 221.0 lb

## 2015-05-10 DIAGNOSIS — R030 Elevated blood-pressure reading, without diagnosis of hypertension: Secondary | ICD-10-CM | POA: Diagnosis not present

## 2015-05-10 DIAGNOSIS — R202 Paresthesia of skin: Secondary | ICD-10-CM | POA: Diagnosis not present

## 2015-05-10 DIAGNOSIS — R7301 Impaired fasting glucose: Secondary | ICD-10-CM | POA: Diagnosis not present

## 2015-05-10 DIAGNOSIS — R635 Abnormal weight gain: Secondary | ICD-10-CM | POA: Diagnosis not present

## 2015-05-10 DIAGNOSIS — IMO0001 Reserved for inherently not codable concepts without codable children: Secondary | ICD-10-CM

## 2015-05-10 DIAGNOSIS — E559 Vitamin D deficiency, unspecified: Secondary | ICD-10-CM

## 2015-05-10 LAB — POCT GLYCOSYLATED HEMOGLOBIN (HGB A1C): Hemoglobin A1C: 5.8

## 2015-05-10 MED ORDER — PHENTERMINE HCL 37.5 MG PO TABS: ORAL_TABLET | ORAL | Status: DC

## 2015-05-10 NOTE — Assessment & Plan Note (Addendum)
Continue phentermine. Recheck 1 month. Patient will start researching Saxenda.

## 2015-05-10 NOTE — Patient Instructions (Signed)
Thank you for coming in today. Return in 1 month.  Continue phenetamine.  Continue good food choices.   Research saxenda.    Saxenda (Liraglutide injection) (Weight Management) What is this medicine? LIRAGLUTIDE (LIR a GLOO tide) is used with a reduced calorie diet and exercise to help you lose weight. This medicine may be used for other purposes; ask your health care provider or pharmacist if you have questions. What should I tell my health care provider before I take this medicine? They need to know if you have any of these conditions: -endocrine tumors (MEN 2) or if someone in your family had these tumors -gallstones -high cholesterol -history of alcohol abuse problem -history of pancreatitis -kidney disease or if you are on dialysis -liver disease -previous swelling of the tongue, face, or lips with difficulty breathing, difficulty swallowing, hoarseness, or tightening of the throat -stomach problems -suicidal thoughts, plans, or attempt; a previous suicide attempt by you or a family member -thyroid cancer or if someone in your family had thyroid cancer -an unusual or allergic reaction to liraglutide, medicines, foods, dyes, or preservatives -pregnant or trying to get pregnant -breast-feeding How should I use this medicine? This medicine is for injection under the skin of your upper leg, stomach area, or upper arm. You will be taught how to prepare and give this medicine. Use exactly as directed. Take your medicine at regular intervals. Do not take it more often than directed. It is important that you put your used needles and syringes in a special sharps container. Do not put them in a trash can. If you do not have a sharps container, call your pharmacist or healthcare provider to get one. A special MedGuide will be given to you by the pharmacist with each prescription and refill. Be sure to read this information carefully each time. Talk to your pediatrician regarding the use of  this medicine in children. Special care may be needed. Overdosage: If you think you have taken too much of this medicine contact a poison control center or emergency room at once. NOTE: This medicine is only for you. Do not share this medicine with others. What if I miss a dose? If you miss a dose, take it as soon as you can. If it is almost time for your next dose, take only that dose. Do not take double or extra doses. If you miss your dose for 3 days or more, call your doctor or health care professional to talk about how to restart this medicine. What may interact with this medicine? -acetaminophen -atorvastatin -birth control pills -digoxin -griseofulvin -lisinopril This list may not describe all possible interactions. Give your health care provider a list of all the medicines, herbs, non-prescription drugs, or dietary supplements you use. Also tell them if you smoke, drink alcohol, or use illegal drugs. Some items may interact with your medicine. What should I watch for while using this medicine? Visit your doctor or health care professional for regular checks on your progress. This medicine is intended to be used in addition to a healthy diet and appropriate exercise. The best results are achieved this way. Do not increase or in any way change your dose without consulting your doctor or health care professional. This medicine may affect blood sugar levels. If you have diabetes, check with your doctor or health care professional before you change your diet or the dose of your diabetic medicine. Patients and their families should watch out for worsening depression or thoughts of suicide. Also  watch out for sudden changes in feelings such as feeling anxious, agitated, panicky, irritable, hostile, aggressive, impulsive, severely restless, overly excited and hyperactive, or not being able to sleep. If this happens, especially at the beginning of treatment or after a change in dose, call your health  care professional. What side effects may I notice from receiving this medicine? Side effects that you should report to your doctor or health care professional as soon as possible: -allergic reactions like skin rash, itching or hives, swelling of the face, lips, or tongue -breathing problems -fever, chills -loss of appetite -signs and symptoms of low blood sugar such as feeling anxious, confusion, dizziness, increased hunger, unusually weak or tired, sweating, shakiness, cold, irritable, headache, blurred vision, fast heartbeat, loss of consciousness -trouble passing urine or change in the amount of urine -unusual stomach pain or upset -vomiting Side effects that usually do not require medical attention (Report these to your doctor or health care professional if they continue or are bothersome.): -constipation -diarrhea -fatigue -headache -nausea This list may not describe all possible side effects. Call your doctor for medical advice about side effects. You may report side effects to FDA at 1-800-FDA-1088. Where should I keep my medicine? Keep out of the reach of children. Store unopened pen in a refrigerator between 2 and 8 degrees C (36 and 46 degrees F). Do not freeze or use if the medicine has been frozen. Protect from light and excessive heat. After you first use the pen, it can be stored at room temperature between 15 and 30 degrees C (59 and 86 degrees F) or in a refrigerator. Throw away your used pen after 30 days or after the expiration date, whichever comes first. Do not store your pen with the needle attached. If the needle is left on, medicine may leak from the pen. NOTE: This sheet is a summary. It may not cover all possible information. If you have questions about this medicine, talk to your doctor, pharmacist, or health care provider.    2016, Elsevier/Gold Standard. (2013-06-15 12:29:49)

## 2015-05-10 NOTE — Progress Notes (Signed)
       Frank Orozco is a 45 y.o. male who presents to [facility]: [facility] today for abnormal weight gain follow-up. Patient was seen last month and was started on phentermine. In the interim he has lost 14 pounds. He denies any significant side effects up to including chest pain palpitations or jitteriness or erectile dysfunction. He continues to be careful with his diet. He feels great.  Prediabetes: At the last visit patient was found to have elevated fasting blood sugar. A1c checked today. No polyuria or polydipsia  Paresthesias: The last visit patient complained of right anterior thigh paresthesias. This was thought to be possibly lumbar radicular versus meralgia paresthetica. He notes with weight loss his symptoms have improved.  Vitamin D deficiency: Noted at last lab visit. Patient was prescribed 50,000 units of vitamin D weekly. He is taking his medications without any problems.   Past Medical History  Diagnosis Date  . Erectile dysfunction    Past Surgical History  Procedure Laterality Date  . Wisdom tooth extraction  2010  . Vasectomy Bilateral 11/09/2012    Procedure: BILATERAL VASECTOMY;  Surgeon: Molli Hazard, MD;  Location: [facility];  Service: Urology;  Laterality: Bilateral;  SPERMATIC CORD BLOCK     Social History  Substance Use Topics  . Smoking status: Never Smoker   . Smokeless tobacco: Never Used  . Alcohol Use: Yes     Comment: 3 drinks a month   family history includes Arthritis in his mother.  ROS as above Medications: Current Outpatient Prescriptions  Medication Sig Dispense Refill  . ibuprofen (ADVIL,MOTRIN) 200 MG tablet Take 400 mg by mouth every 6 (six) hours as needed (pain).    . phentermine (ADIPEX-P) 37.5 MG tablet One tab by mouth qAM 30 tablet 0  . Vitamin D, Ergocalciferol, (DRISDOL) 50000 UNITS CAPS capsule Take 1 capsule (50,000 Units total) by  mouth every 7 (seven) days. Take for 8 total doses(weeks) 8 capsule 0   No current facility-administered medications for this visit.   No Known Allergies   Exam:  BP 141/96 mmHg  Pulse 79  Wt 221 lb (100.245 kg)  Wt Readings from Last 3 Encounters:  05/10/15 221 lb (100.245 kg)  04/04/15 235 lb (106.595 kg)  01/12/14 233 lb (105.688 kg)    Gen: Well NAD HEENT: EOMI,  MMM Lungs: Normal work of breathing. CTABL Heart: RRR no MRG Abd: NABS, Soft. Nondistended, Nontender Exts: Brisk capillary refill, warm and well perfused.   Results for orders placed or performed in visit on 05/10/15 (from the past 24 hour(s))  POCT HgB A1C     Status: None   Collection Time: 05/10/15  9:21 AM  Result Value Ref Range   Hemoglobin A1C 5.8    No results found.   Please see individual assessment and plan sections.

## 2015-05-10 NOTE — Assessment & Plan Note (Signed)
We'll continue to decrease with losing weight with phentermine. Recheck 1 month. If still continuing to be elevated or worsening will stop phentermine or start blood pressure management with medications.

## 2015-05-10 NOTE — Assessment & Plan Note (Signed)
Continue prescription 50,000 unit vitamin D. Discussed plan for 1000-5000 units of vitamin D over-the-counter after the prescription runs out

## 2015-05-10 NOTE — Assessment & Plan Note (Signed)
Patient notes his right leg paresthesias have improved with weight loss.

## 2015-05-10 NOTE — Assessment & Plan Note (Signed)
A1c checked today. Consistent with fasting blood glucose from last month. Continue weight loss. Patient may benefit strongly from GLP-1 agonist for further weight management and prediabetes control.

## 2015-06-10 ENCOUNTER — Encounter: Payer: Self-pay | Admitting: Family Medicine

## 2015-06-10 ENCOUNTER — Ambulatory Visit (INDEPENDENT_AMBULATORY_CARE_PROVIDER_SITE_OTHER): Payer: BLUE CROSS/BLUE SHIELD | Admitting: Family Medicine

## 2015-06-10 VITALS — BP 136/85 | HR 93 | Wt 216.0 lb

## 2015-06-10 DIAGNOSIS — R635 Abnormal weight gain: Secondary | ICD-10-CM | POA: Diagnosis not present

## 2015-06-10 MED ORDER — PHENTERMINE HCL 37.5 MG PO TABS: ORAL_TABLET | ORAL | Status: DC

## 2015-06-10 NOTE — Progress Notes (Signed)
       Frank Orozco is a 45 y.o. male who presents to [place]: [facility] today for abnormal weight gain.  Patient returns to clinic today for weight loss management. He has lost 6 pounds since the last visit. Overall he has lost 19 pounds around 8% of his body weight in the last 2 months on phentermine. He tolerates the medicine well with no significant side effects. No chest pain palpitations or shortness of breath. No erectile dysfunction.  Grief: Patient's mother died unexpectedly several months ago and his father died recently. He seems to be doing well with no obvious uncontrollable grief. He had some services through hospice.   Past Medical History  Diagnosis Date  . Erectile dysfunction    Past Surgical History  Procedure Laterality Date  . Wisdom tooth extraction  2010  . Vasectomy Bilateral 11/09/2012    Procedure: BILATERAL VASECTOMY;  Surgeon: Molli Hazard, MD;  Location: [facility];  Service: Urology;  Laterality: Bilateral;  SPERMATIC CORD BLOCK     Social History  Substance Use Topics  . Smoking status: Never Smoker   . Smokeless tobacco: Never Used  . Alcohol Use: Yes     Comment: 3 drinks a month   family history includes Arthritis in his mother.  ROS as above Medications: Current Outpatient Prescriptions  Medication Sig Dispense Refill  . ibuprofen (ADVIL,MOTRIN) 200 MG tablet Take 400 mg by mouth every 6 (six) hours as needed (pain).    . phentermine (ADIPEX-P) 37.5 MG tablet One tab by mouth qAM 30 tablet 0  . Vitamin D, Ergocalciferol, (DRISDOL) 50000 UNITS CAPS capsule Take 1 capsule (50,000 Units total) by mouth every 7 (seven) days. Take for 8 total doses(weeks) 8 capsule 0   No current facility-administered medications for this visit.   No Known Allergies   Exam:  BP 136/85 mmHg  Pulse 93  Wt 216 lb (97.977 kg)  SpO2 99%  Wt Readings from Last 3  Encounters:  06/10/15 216 lb (97.977 kg)  05/10/15 221 lb (100.245 kg)  04/04/15 235 lb (106.595 kg)    Gen: Well NAD Psych: Alert and oriented normal speech thought process and affect.   No results found for this or any previous visit (from the past 24 hour(s)). No results found.   Please see individual assessment and plan sections.

## 2015-06-10 NOTE — Patient Instructions (Signed)
Thank you for coming in today. Continue phentermine.  Research contrave and belviq  Lorcaserin oral tablets What is this medicine? LORCASERIN (lor ca SER in) is used to promote and maintain weight loss in obese patients. This medicine should be used with a reduced calorie diet and, if appropriate, an exercise program. This medicine may be used for other purposes; ask your health care provider or pharmacist if you have questions. What should I tell my health care provider before I take this medicine? They need to know if you have any of these conditions: -anatomical deformation of the penis, Peyronie's disease, or history of priapism (painful and prolonged erection) -diabetes -heart disease -history of blood diseases, like sickle cell anemia or leukemia -history of irregular heartbeat -kidney disease -liver disease -suicidal thoughts, plans, or attempt; a previous suicide attempt by you or a family member -an unusual or allergic reaction to lorcaserin, other medicines, foods, dyes, or preservatives -pregnant or trying to get pregnant -breast-feeding How should I use this medicine? Take this medicine by mouth with a glass of water. Follow the directions on the prescription label. You can take it with or without food. Take your medicine at regular intervals. Do not take it more often than directed. Do not stop taking except on your doctor's advice. Talk to your pediatrician regarding the use of this medicine in children. Special care may be needed. Overdosage: If you think you have taken too much of this medicine contact a poison control center or emergency room at once. NOTE: This medicine is only for you. Do not share this medicine with others. What if I miss a dose? If you miss a dose, take it as soon as you can. If it is almost time for your next dose, take only that dose. Do not take double or extra doses. What may interact with this medicine? -cabergoline -certain medicines for  depression, anxiety, or psychotic disturbances -certain medicines for erectile dysfunction -certain medicines for migraine headache like almotriptan, eletriptan, frovatriptan, naratriptan, rizatriptan, sumatriptan, zolmitriptan -dextromethorphan -linezolid -lithium -medicines for diabetes -other weight loss products -tramadol -St. John's Wort -stimulant medicines for attention disorders, weight loss, or to stay awake -tryptophan This list may not describe all possible interactions. Give your health care provider a list of all the medicines, herbs, non-prescription drugs, or dietary supplements you use. Also tell them if you smoke, drink alcohol, or use illegal drugs. Some items may interact with your medicine. What should I watch for while using this medicine? This medicine is intended to be used in addition to a healthy diet and appropriate exercise. The best results are achieved this way. Your doctor should instruct you to stop taking this medicine if you do not lose a certain amount of weight within the first 12 weeks of treatment, but it is important that you do not change your dose in any way without consulting your doctor or health care professional. Visit your doctor or health care professional for regular checkups. Your doctor may order blood tests or other tests to see how you are doing. Do not drive, use machinery, or do anything that needs mental alertness until you know how this medicine affects you. This medicine may affect blood sugar levels. If you have diabetes, check with your doctor or health care professional before you change your diet or the dose of your diabetic medicine. Patients and their families should watch out for worsening depression or thoughts of suicide. Also watch out for sudden changes in feelings such as feeling  anxious, agitated, panicky, irritable, hostile, aggressive, impulsive, severely restless, overly excited and hyperactive, or not being able to sleep. If  this happens, especially at the beginning of treatment or after a change in dose, call your health care professional. Contact your doctor or health care professional right away if you are a man with an erection that lasts longer than 4 hours or if the erection becomes painful. This may be a sign of serious problem and must be treated right away to prevent permanent damage. What side effects may I notice from receiving this medicine? Side effects that you should report to your doctor or health care professional as soon as possible: -allergic reactions like skin rash, itching or hives, swelling of the face, lips, or tongue -abnormal production of milk -breast enlargement in both males and females -breathing problems -changes in emotions or moods -changes in vision -confusion -erection lasting more than 4 hours or a painful erection -fast or irregular heart beat -feeling faint or lightheaded, falls -fever or chills, sore throat -hallucination, loss of contact with reality -high or low blood pressure -menstrual changes -restlessness -slow or irregular heartbeat -stiff muscles -sweating -suicidal thoughts or other mood changes -swelling of the ankles, feet, hands -unusually weak or tired -vomiting Side effects that usually do not require medical attention (Report these to your doctor or health care professional if they continue or are bothersome.): -back pain -constipation -cough -dry mouth -nausea -tiredness This list may not describe all possible side effects. Call your doctor for medical advice about side effects. You may report side effects to FDA at 1-800-FDA-1088. Where should I keep my medicine? Keep out of the reach of children. This medicine can be abused. Keep your medicine in a safe place to protect it from theft. Do not share this medicine with anyone. Selling or giving away this medicine is dangerous and against the law. Store at room temperature between 15 and 30 degrees  C (59 and 86 degrees F). Throw away any unused medicine after the expiration date. NOTE: This sheet is a summary. It may not cover all possible information. If you have questions about this medicine, talk to your doctor, pharmacist, or health care provider.    2016, Elsevier/Gold Standard. (2014-11-26 16:21:05)   Bupropion; Naltrexone extended-release tablets What is this medicine? BUPROPION; NALTREXONE (byoo PROE pee on; nal TREX one) is a combination product used to promote and maintain weight loss in obese adults or overweight adults who also have weight related medical problems. This medicine should be used with a reduced calorie diet and increased physical activity. This medicine may be used for other purposes; ask your health care provider or pharmacist if you have questions. What should I tell my health care provider before I take this medicine? They need to know if you have any of these conditions: -an eating disorder, such as anorexia or bulimia -diabetes -glaucoma -head injury -heart disease -high blood pressure -history of a drug or alcohol abuse problem -history of a tumor or infection of your brain or spine -history of stroke -history of irregular heartbeat -kidney disease -liver disease -mental illness such as bipolar disorder or psychosis -seizures -suicidal thoughts, plans, or attempt; a previous suicide attempt by you or a family member -an unusual or allergic reaction to bupropion, naltrexone, other medicines, foods, dyes, or preservatives breast-feeding -pregnant or trying to become pregnant How should I use this medicine? Take this medicine by mouth with a glass of water. Follow the directions on the prescription label. Take  this medicine in the morning and in the evenings as directed by your healthcare professional. Dennis Bast can take it with or without food. Do not take with high-fat meals as this may increase your risk of seizures. Do not crush, chew, or cut these  tablets. Do not take your medicine more often than directed. Do not stop taking this medicine suddenly except upon the advice of your doctor. A special MedGuide will be given to you by the pharmacist with each prescription and refill. Be sure to read this information carefully each time. Talk to your pediatrician regarding the use of this medicine in children. Special care may be needed. Overdosage: If you think you have taken too much of this medicine contact a poison control center or emergency room at once. NOTE: This medicine is only for you. Do not share this medicine with others. What if I miss a dose? If you miss a dose, skip the missed dose and take your next tablet at the regular time. Do not take double or extra doses. What may interact with this medicine? Do not take this medicine with any of the following medications: -any prescription or street opioid drug like codiene, heroin, methadone -linezolid -MAOIs like Carbex, Eldepryl, Marplan, Nardil, and Parnate -methylene blue (injected into a vein) -other medicines that contain bupropion like Zyban or Wellbutrin This medicine may also interact with the following medications: -alcohol -certain medicines for anxiety or sleep -certain medicines for blood pressure like metoprolol, propranolol -certain medicines for depression or psychotic disturbances -certain medicines for HIV or AIDS like efavirenz, lopinavir, nelfinavir, ritonavir -certain medicines for irregular heart beat like propafenone, flecainide -certain medicines for Parkinson's disease like amantadine, levodopa -certain medicines for seizures like carbamazepine, phenytoin, phenobarbital -cimetidine -clopidogrel -cyclophosphamide -disulfiram -furazolidone -isoniazid -nicotine -orphenadrine -procarbazine -steroid medicines like prednisone or cortisone -stimulant medicines for attention disorders, weight loss, or to stay  awake -tamoxifen -theophylline -thioridazine -thiotepa -ticlopidine -tramadol -warfarin This list may not describe all possible interactions. Give your health care provider a list of all the medicines, herbs, non-prescription drugs, or dietary supplements you use. Also tell them if you smoke, drink alcohol, or use illegal drugs. Some items may interact with your medicine. What should I watch for while using this medicine? This medicine is intended to be used in addition to a healthy diet and appropriate exercise. The best results are achieved this way. Do not increase or in any way change your dose without consulting your doctor or health care professional. Do not take this medicine with other prescription or over-the-counter weight loss products without consulting your doctor or health care professional. Your doctor should tell you to stop taking this medicine if you do not lose a certain amount of weight within the first 12 weeks of treatment. Visit your doctor or health care professional for regular checkups. Your doctor may order blood tests or other tests to see how you are doing. This medicine may affect blood sugar levels. If you have diabetes, check with your doctor or health care professional before you change your diet or the dose of your diabetic medicine. Patients and their families should watch out for new or worsening depression or thoughts of suicide. Also watch out for sudden changes in feelings such as feeling anxious, agitated, panicky, irritable, hostile, aggressive, impulsive, severely restless, overly excited and hyperactive, or not being able to sleep. If this happens, especially at the beginning of treatment or after a change in dose, call your health care professional. Avoid alcoholic drinks while  taking this medicine. Drinking large amounts of alcoholic beverages, using sleeping or anxiety medicines, or quickly stopping the use of these agents while taking this medicine may  increase your risk for a seizure. What side effects may I notice from receiving this medicine? Side effects that you should report to your doctor or health care professional as soon as possible: -allergic reactions like skin rash, itching or hives, swelling of the face, lips, or tongue -breathing problems -changes in vision, hearing -chest pain -confusion -dark urine -depressed mood -fast or irregular heart beat -fever -hallucination, loss of contact with reality -increased blood pressure -light-colored stools -redness, blistering, peeling or loosening of the skin, including inside the mouth -right upper belly pain -seizures -suicidal thoughts or other mood changes -unusually weak or tired -vomiting -yellowing of the eyes or skin Side effects that usually do not require medical attention (Report these to your doctor or health care professional if they continue or are bothersome.): -constipation -diarrhea -dizziness -dry mouth -headache -nausea -trouble sleeping This list may not describe all possible side effects. Call your doctor for medical advice about side effects. You may report side effects to FDA at 1-800-FDA-1088. Where should I keep my medicine? Keep out of the reach of children. Store at room temperature between 15 and 30 degrees C (59 and 86 degrees F). Throw away any unused medicine after the expiration date. NOTE: This sheet is a summary. It may not cover all possible information. If you have questions about this medicine, talk to your doctor, pharmacist, or health care provider.    2016, Elsevier/Gold Standard. (2013-01-25 15:17:29)

## 2015-06-10 NOTE — Assessment & Plan Note (Signed)
Doing well. Continue phentermine. Recheck next month.  Research contrave and belviq.

## 2015-07-08 ENCOUNTER — Ambulatory Visit: Payer: BLUE CROSS/BLUE SHIELD | Admitting: Family Medicine

## 2015-07-09 ENCOUNTER — Other Ambulatory Visit: Payer: Self-pay | Admitting: Family Medicine

## 2015-07-09 ENCOUNTER — Ambulatory Visit: Payer: BLUE CROSS/BLUE SHIELD | Admitting: Family Medicine

## 2015-07-15 ENCOUNTER — Ambulatory Visit (INDEPENDENT_AMBULATORY_CARE_PROVIDER_SITE_OTHER): Payer: BLUE CROSS/BLUE SHIELD | Admitting: Family Medicine

## 2015-07-15 ENCOUNTER — Encounter: Payer: Self-pay | Admitting: Family Medicine

## 2015-07-15 VITALS — BP 130/90 | HR 70 | Ht 70.0 in | Wt 219.0 lb

## 2015-07-15 DIAGNOSIS — R635 Abnormal weight gain: Secondary | ICD-10-CM | POA: Diagnosis not present

## 2015-07-15 MED ORDER — LORCASERIN HCL 10 MG PO TABS: 10.0000 mg | ORAL_TABLET | Freq: Two times a day (BID) | ORAL | Status: DC

## 2015-07-15 NOTE — Assessment & Plan Note (Signed)
Start Belviq. F/u in 2 months.

## 2015-07-15 NOTE — Progress Notes (Signed)
       Frank Orozco is a 45 y.o. male who presents to [facility] today for follow up weight loss.   Patient has been taking phenetamine for 3 months now. Since last month he notes that he he thinks the phentermine has stopped working. He has gained about 3 pounds. Otherwise he feels well with no palpitations chest pains or shortness of breath. After doing research on other weight loss medications he would like to start he Belviq.    Past Medical History  Diagnosis Date  . Erectile dysfunction    Past Surgical History  Procedure Laterality Date  . Wisdom tooth extraction  2010  . Vasectomy Bilateral 11/09/2012    Procedure: BILATERAL VASECTOMY;  Surgeon: Molli Hazard, MD;  Location: [facility];  Service: Urology;  Laterality: Bilateral;  SPERMATIC CORD BLOCK     Social History  Substance Use Topics  . Smoking status: Never Smoker   . Smokeless tobacco: Never Used  . Alcohol Use: Yes     Comment: 3 drinks a month   family history includes Arthritis in his mother.  ROS as above Medications: Current Outpatient Prescriptions  Medication Sig Dispense Refill  . ibuprofen (ADVIL,MOTRIN) 200 MG tablet Take 400 mg by mouth every 6 (six) hours as needed (pain).    . phentermine (ADIPEX-P) 37.5 MG tablet One tab by mouth qAM 30 tablet 0  . Vitamin D, Ergocalciferol, (DRISDOL) 50000 UNITS CAPS capsule Take 1 capsule (50,000 Units total) by mouth every 7 (seven) days. Take for 8 total doses(weeks) 8 capsule 0  . Lorcaserin HCl (BELVIQ) 10 MG TABS Take 10 mg by mouth 2 (two) times daily. 60 tablet 2   No current facility-administered medications for this visit.   No Known Allergies   Exam:  BP 130/90 mmHg  Pulse 70  Ht 5' 10"  (1.778 m)  Wt 219 lb (99.338 kg)  BMI 31.42 kg/m2 Gen: Well NAD  Wt Readings from Last 3 Encounters:  07/15/15 219 lb (99.338 kg)  06/10/15 216 lb  (97.977 kg)  05/10/15 221 lb (100.245 kg)     No results found for this or any previous visit (from the past 24 hour(s)). No results found.   Please see individual assessment and plan sections.

## 2015-07-15 NOTE — Patient Instructions (Signed)
Thank you for coming in today. Start Belviq.  I am not 100% sure that your insurance will cover this fully. We may have to switch.  Return in 2 months or so.   Lorcaserin oral tablets What is this medicine? LORCASERIN (lor ca SER in) is used to promote and maintain weight loss in obese patients. This medicine should be used with a reduced calorie diet and, if appropriate, an exercise program. This medicine may be used for other purposes; ask your health care provider or pharmacist if you have questions. What should I tell my health care provider before I take this medicine? They need to know if you have any of these conditions: -anatomical deformation of the penis, Peyronie's disease, or history of priapism (painful and prolonged erection) -diabetes -heart disease -history of blood diseases, like sickle cell anemia or leukemia -history of irregular heartbeat -kidney disease -liver disease -suicidal thoughts, plans, or attempt; a previous suicide attempt by you or a family member -an unusual or allergic reaction to lorcaserin, other medicines, foods, dyes, or preservatives -pregnant or trying to get pregnant -breast-feeding How should I use this medicine? Take this medicine by mouth with a glass of water. Follow the directions on the prescription label. You can take it with or without food. Take your medicine at regular intervals. Do not take it more often than directed. Do not stop taking except on your doctor's advice. Talk to your pediatrician regarding the use of this medicine in children. Special care may be needed. Overdosage: If you think you have taken too much of this medicine contact a poison control center or emergency room at once. NOTE: This medicine is only for you. Do not share this medicine with others. What if I miss a dose? If you miss a dose, take it as soon as you can. If it is almost time for your next dose, take only that dose. Do not take double or extra doses. What  may interact with this medicine? -cabergoline -certain medicines for depression, anxiety, or psychotic disturbances -certain medicines for erectile dysfunction -certain medicines for migraine headache like almotriptan, eletriptan, frovatriptan, naratriptan, rizatriptan, sumatriptan, zolmitriptan -dextromethorphan -linezolid -lithium -medicines for diabetes -other weight loss products -tramadol -St. John's Wort -stimulant medicines for attention disorders, weight loss, or to stay awake -tryptophan This list may not describe all possible interactions. Give your health care provider a list of all the medicines, herbs, non-prescription drugs, or dietary supplements you use. Also tell them if you smoke, drink alcohol, or use illegal drugs. Some items may interact with your medicine. What should I watch for while using this medicine? This medicine is intended to be used in addition to a healthy diet and appropriate exercise. The best results are achieved this way. Your doctor should instruct you to stop taking this medicine if you do not lose a certain amount of weight within the first 12 weeks of treatment, but it is important that you do not change your dose in any way without consulting your doctor or health care professional. Visit your doctor or health care professional for regular checkups. Your doctor may order blood tests or other tests to see how you are doing. Do not drive, use machinery, or do anything that needs mental alertness until you know how this medicine affects you. This medicine may affect blood sugar levels. If you have diabetes, check with your doctor or health care professional before you change your diet or the dose of your diabetic medicine. Patients and their families  should watch out for worsening depression or thoughts of suicide. Also watch out for sudden changes in feelings such as feeling anxious, agitated, panicky, irritable, hostile, aggressive, impulsive, severely  restless, overly excited and hyperactive, or not being able to sleep. If this happens, especially at the beginning of treatment or after a change in dose, call your health care professional. Contact your doctor or health care professional right away if you are a man with an erection that lasts longer than 4 hours or if the erection becomes painful. This may be a sign of serious problem and must be treated right away to prevent permanent damage. What side effects may I notice from receiving this medicine? Side effects that you should report to your doctor or health care professional as soon as possible: -allergic reactions like skin rash, itching or hives, swelling of the face, lips, or tongue -abnormal production of milk -breast enlargement in both males and females -breathing problems -changes in emotions or moods -changes in vision -confusion -erection lasting more than 4 hours or a painful erection -fast or irregular heart beat -feeling faint or lightheaded, falls -fever or chills, sore throat -hallucination, loss of contact with reality -high or low blood pressure -menstrual changes -restlessness -slow or irregular heartbeat -stiff muscles -sweating -suicidal thoughts or other mood changes -swelling of the ankles, feet, hands -unusually weak or tired -vomiting Side effects that usually do not require medical attention (Report these to your doctor or health care professional if they continue or are bothersome.): -back pain -constipation -cough -dry mouth -nausea -tiredness This list may not describe all possible side effects. Call your doctor for medical advice about side effects. You may report side effects to FDA at 1-800-FDA-1088. Where should I keep my medicine? Keep out of the reach of children. This medicine can be abused. Keep your medicine in a safe place to protect it from theft. Do not share this medicine with anyone. Selling or giving away this medicine is dangerous  and against the law. Store at room temperature between 15 and 30 degrees C (59 and 86 degrees F). Throw away any unused medicine after the expiration date. NOTE: This sheet is a summary. It may not cover all possible information. If you have questions about this medicine, talk to your doctor, pharmacist, or health care provider.    2016, Elsevier/Gold Standard. (2014-11-26 16:21:05)

## 2015-09-16 ENCOUNTER — Ambulatory Visit: Payer: BLUE CROSS/BLUE SHIELD | Admitting: Family Medicine

## 2015-10-24 ENCOUNTER — Encounter: Payer: Self-pay | Admitting: Family Medicine

## 2015-10-24 ENCOUNTER — Other Ambulatory Visit: Payer: Self-pay

## 2015-10-24 ENCOUNTER — Ambulatory Visit (INDEPENDENT_AMBULATORY_CARE_PROVIDER_SITE_OTHER): Payer: BLUE CROSS/BLUE SHIELD | Admitting: Family Medicine

## 2015-10-24 VITALS — BP 110/80 | HR 90 | Wt 235.4 lb

## 2015-10-24 DIAGNOSIS — L600 Ingrowing nail: Secondary | ICD-10-CM | POA: Diagnosis not present

## 2015-10-24 MED ORDER — HYDROCODONE-ACETAMINOPHEN 5-325 MG PO TABS: 1.0000 | ORAL_TABLET | Freq: Four times a day (QID) | ORAL | Status: DC | PRN

## 2015-10-24 NOTE — Progress Notes (Signed)
   Subjective:    Patient ID: Frank Orozco, male    DOB: 1970/06/28, 45 y.o.   MRN: [id number]  HPI He has had difficulty with ingrown right great toenail. He has been trying to take care of this at home with not much success. His causing swelling and pain.   Review of Systems     Objective:   Physical Exam The medial aspect of the right great nail is red, swollen with some granulation tissue.It is not hot and no drainage is noted.       Assessment & Plan:  Ingrown nail A digital block was performed with good anesthesia. The toe was wrapped with a tourniquet and a tourniquet applied to the base. Unfortunately not good hemostasis was obtained and it did bleed. I rewrapped it and again had difficulty with control of the bleeding. Nail was partially excised along the medial aspect of the nailbed and debrided. Was packed with iodoform and pressure dressing was applied. Encouraged him to rewrap overtop of this if it bleeds through. After 48 hours he can then soak off the dressing and use warm soaks on this several times today. I will also call in Vicodin.

## 2016-09-09 ENCOUNTER — Encounter: Payer: Self-pay | Admitting: Family Medicine

## 2016-09-09 ENCOUNTER — Ambulatory Visit (INDEPENDENT_AMBULATORY_CARE_PROVIDER_SITE_OTHER): Payer: Commercial Managed Care - HMO | Admitting: Family Medicine

## 2016-09-09 VITALS — BP 124/80 | HR 74 | Wt 233.0 lb

## 2016-09-09 DIAGNOSIS — L600 Ingrowing nail: Secondary | ICD-10-CM

## 2016-09-09 NOTE — Progress Notes (Signed)
   Subjective:    Patient ID: Frank Orozco, male    DOB: Sep 23, 1970, 46 y.o.   MRN: [id number]  HPI He is here for evaluation of difficulty with ingrown nail on the left great toe. He has been working on self remove some of the nail but is still giving him difficulty.   Review of Systems     Objective:   Physical Exam Left great toenail does show some swelling, slight erythema and granulation tissue along the lateral aspect of the nailbed. No purulent drainage noted.       Assessment & Plan:  Ingrown nail The toe was digitally injected with Xylocaine. Adequate anesthesia was obtained. The toe was wrapped with a tourniquet to ensure adequate hemostasis. The lateral aspect of the toenail, proximally 0.5 cm was removed as well as the nailbed. Normal tissue was observed. The nailbed was cauterized with silver nitrate to ensure adequate hemostasis. Iodoform gauze was placed into the wound and a compression dressing was applied. He will return here on Friday for removal of the packing.

## 2016-09-11 ENCOUNTER — Encounter: Payer: Self-pay | Admitting: Medical

## 2016-09-11 ENCOUNTER — Ambulatory Visit (INDEPENDENT_AMBULATORY_CARE_PROVIDER_SITE_OTHER): Payer: Commercial Managed Care - HMO | Admitting: Medical

## 2016-09-11 VITALS — HR 76 | Temp 98.1°F | Wt 233.6 lb

## 2016-09-11 DIAGNOSIS — L03032 Cellulitis of left toe: Secondary | ICD-10-CM

## 2016-09-11 DIAGNOSIS — L600 Ingrowing nail: Secondary | ICD-10-CM

## 2016-09-11 MED ORDER — AMOXICILLIN-POT CLAVULANATE 875-125 MG PO TABS: 1.0000 | ORAL_TABLET | Freq: Two times a day (BID) | ORAL | 0 refills | Status: DC

## 2016-09-11 MED ORDER — HYDROCODONE-ACETAMINOPHEN 7.5-325 MG PO TABS: 1.0000 | ORAL_TABLET | Freq: Four times a day (QID) | ORAL | 0 refills | Status: DC | PRN

## 2016-09-11 NOTE — Progress Notes (Signed)
Subjective: Chief Complaint  Patient presents with  . follow up    follow up from in  grown toe nail    Here for toe nail f/u.  He was seen earlier in the week by Dr. Redmond School for ingrown toenail had procedure.  He notes after the anesthesia wore off 2 days ago, had immediate pain again.   He has had 2 days of purulent drainage, redness, swelling.   Its stays painful.  No fever, no NVD. No hx/o diabetes, HIV, or other immunocompromised state.  No other aggravating or relieving factors. No other complaint.  Past Medical History:  Diagnosis Date  . Erectile dysfunction    No current outpatient prescriptions on file prior to visit.   No current facility-administered medications on file prior to visit.     ROS as in subjective   Objective: Pulse 76   Temp 98.1 F (36.7 C)   Wt 233 lb 9.6 oz (106 kg)   SpO2 96%   BMI 33.52 kg/m   Left great toe lateral nail bed with purulent discharge.  Lateral left toe generally swollen and red and warm.  No fluctuance or induration otherwise foot and toes unremarkable Feet neurovascularly intact No gangrene   Assessment: Encounter Diagnoses  Name Primary?  . Ingrown toenail Yes  . Cellulitis of toe of left foot     Plan: Discussed findings, symptoms, and called Dr. Redmond School to discuss case as well.    Begin medications below, OTC Ibuprofen 250m, 3 tablets TID over the weekend, 267m soapy water epsom salt soaks, keep the toe otherwise clean and dry.   f/u early next week.  If fever or much worse over weekend, spreading redness, then call after hours line or go to the ED.  Frank Orozco seen today for follow up.  Diagnoses and all orders for this visit:  Ingrown toenail  Cellulitis of toe of left foot  Other orders -     HYDROcodone-acetaminophen (NORCO) 7.5-325 MG tablet; Take 1 tablet by mouth every 6 (six) hours as needed for moderate pain. -     amoxicillin-clavulanate (AUGMENTIN) 875-125 MG tablet; Take 1 tablet by mouth 2 (two)  times daily.

## 2016-09-14 ENCOUNTER — Telehealth: Payer: Self-pay

## 2016-09-14 NOTE — Telephone Encounter (Signed)
Make a note to check with him on Thursday

## 2016-09-14 NOTE — Telephone Encounter (Signed)
Pt called the office and wanted to let you know infection has improved. He is still taking ABX. CB # [id number]

## 2016-09-15 NOTE — Telephone Encounter (Signed)
Patient states that infection is gone the redness is gone just looks nasty because nail is trying to grow back

## 2016-09-15 NOTE — Telephone Encounter (Signed)
Called pt again today per request of JCL in his office left VM to please call back and let us know how his toe is

## 2016-09-22 ENCOUNTER — Encounter: Payer: BLUE CROSS/BLUE SHIELD | Admitting: Family Medicine

## 2016-09-23 ENCOUNTER — Ambulatory Visit (INDEPENDENT_AMBULATORY_CARE_PROVIDER_SITE_OTHER): Payer: Commercial Managed Care - HMO | Admitting: Family Medicine

## 2016-09-23 ENCOUNTER — Encounter: Payer: Self-pay | Admitting: Family Medicine

## 2016-09-23 VITALS — BP 120/76 | HR 70 | Ht 71.0 in | Wt 232.0 lb

## 2016-09-23 DIAGNOSIS — E669 Obesity, unspecified: Secondary | ICD-10-CM

## 2016-09-23 DIAGNOSIS — R7301 Impaired fasting glucose: Secondary | ICD-10-CM | POA: Diagnosis not present

## 2016-09-23 DIAGNOSIS — E559 Vitamin D deficiency, unspecified: Secondary | ICD-10-CM

## 2016-09-23 DIAGNOSIS — J301 Allergic rhinitis due to pollen: Secondary | ICD-10-CM

## 2016-09-23 DIAGNOSIS — Z0000 Encounter for general adult medical examination without abnormal findings: Secondary | ICD-10-CM

## 2016-09-23 LAB — CBC WITH DIFFERENTIAL/PLATELET
BASOS ABS: 0 {cells}/uL (ref 0–200)
BASOS PCT: 0 %
EOS ABS: 282 {cells}/uL (ref 15–500)
Eosinophils Relative: 3 %
HEMATOCRIT: 49.2 % (ref 38.5–50.0)
Hemoglobin: 17 g/dL (ref 13.2–17.1)
LYMPHS PCT: 30 %
Lymphs Abs: 2820 cells/uL (ref 850–3900)
MCH: 30.1 pg (ref 27.0–33.0)
MCHC: 34.6 g/dL (ref 32.0–36.0)
MCV: 87.2 fL (ref 80.0–100.0)
MONO ABS: 846 {cells}/uL (ref 200–950)
MPV: 8.9 fL (ref 7.5–12.5)
Monocytes Relative: 9 %
NEUTROS PCT: 58 %
Neutro Abs: 5452 cells/uL (ref 1500–7800)
Platelets: 308 10*3/uL (ref 140–400)
RBC: 5.64 MIL/uL (ref 4.20–5.80)
RDW: 13.2 % (ref 11.0–15.0)
WBC: 9.4 10*3/uL (ref 4.0–10.5)

## 2016-09-23 LAB — LIPID PANEL
CHOL/HDL RATIO: 3.3 ratio (ref <5.0)
CHOLESTEROL: 179 mg/dL (ref <200)
HDL: 54 mg/dL (ref >40)
LDL Cholesterol: 104 mg/dL — ABNORMAL HIGH (ref <100)
Triglycerides: 104 mg/dL (ref <150)
VLDL: 21 mg/dL (ref <30)

## 2016-09-23 LAB — COMPREHENSIVE METABOLIC PANEL
ALK PHOS: 41 U/L (ref 40–115)
ALT: 34 U/L (ref 9–46)
AST: 21 U/L (ref 10–40)
Albumin: 4.6 g/dL (ref 3.6–5.1)
BUN: 15 mg/dL (ref 7–25)
CALCIUM: 9.9 mg/dL (ref 8.6–10.3)
CO2: 25 mmol/L (ref 20–31)
Chloride: 103 mmol/L (ref 98–110)
Creat: 0.93 mg/dL (ref 0.60–1.35)
GLUCOSE: 118 mg/dL — AB (ref 65–99)
POTASSIUM: 4.9 mmol/L (ref 3.5–5.3)
Sodium: 139 mmol/L (ref 135–146)
Total Bilirubin: 1.4 mg/dL — ABNORMAL HIGH (ref 0.2–1.2)
Total Protein: 7.6 g/dL (ref 6.1–8.1)

## 2016-09-23 NOTE — Progress Notes (Signed)
   Subjective:    Patient ID: Frank Orozco, male    DOB: Nov 11, 1970, 46 y.o.   MRN: [id number]  HPI He is here for complete examination. He has no particular concerns or complaints. He does have a previous history of impaired fasting glucose. He has been feeling time and energy and to changing his lifestyle specially his eating habits. His work keeps him physically active[place]. Record also indicates of vitamin D deficiency. He does have underlying seasonal allergies and is using Zyrtec with good results. His work and home life are going well. Family and social history as well as health maintenance and immunizations were reviewed.   Review of Systems  All other systems reviewed and are negative.      Objective:   Physical Exam BP 120/76   Pulse 70   Ht 5' 11"  (1.803 m)   Wt 232 lb (105.2 kg)   SpO2 97%   BMI 32.36 kg/m   General Appearance:    Alert, cooperative, no distress, appears stated age  Head:    Normocephalic, without obvious abnormality, atraumatic  Eyes:    PERRL, conjunctiva/corneas clear, EOM's intact, fundi    benign  Ears:    Normal TM's and external ear canals  Nose:   Nares normal, mucosa normal, no drainage or sinus   tenderness  Throat:   Lips, mucosa, and tongue normal; teeth and gums normal  Neck:   Supple, no lymphadenopathy;  thyroid:  no   enlargement/tenderness/nodules; no carotid   bruit or JVD     Lungs:     Clear to auscultation bilaterally without wheezes, rales or     ronchi; respirations unlabored      Heart:    Regular rate and rhythm, S1 and S2 normal, no murmur, rub   or gallop     Abdomen:     Soft, non-tender, nondistended, normoactive bowel sounds,    no masses, no hepatosplenomegaly  Genitalia:    Normal male external genitalia without lesions.  Testicles without masses.  No inguinal hernias.  Rectal:    Deferred   Extremities:   No clubbing, cyanosis or edema  Pulses:   2+ and symmetric all extremities    Skin:   Skin color, texture, turgor normal, no rashes or lesions  Lymph nodes:   Cervical, supraclavicular, and axillary nodes normal  Neurologic:   CNII-XII intact, normal strength, sensation and gait; reflexes 2+ and symmetric throughout          Psych:   Normal mood, affect, hygiene and grooming.          Assessment & Plan:  Obesity (BMI 30-39.9)  IFG (impaired fasting glucose)  Vitamin D deficiency - Plan: VITAMIN D 25 Hydroxy (Vit-D Deficiency, Fractures)  Seasonal allergic rhinitis due to pollen  Routine general medical examination at a health care facility - Plan: CBC with Differential/Platelet, Comprehensive metabolic panel, Lipid panel Encouraged him to continue to make changes especially in his eating habits by cutting back on carbohydrates. His job seems to keep him very physically active. He will continue to use her take on an as-needed basis. Follow-up

## 2016-09-23 NOTE — Patient Instructions (Signed)
20 minutes a day of something physical. Cut back on white food

## 2016-09-24 LAB — VITAMIN D 25 HYDROXY (VIT D DEFICIENCY, FRACTURES): Vit D, 25-Hydroxy: 23 ng/mL — ABNORMAL LOW (ref 30–100)

## 2016-09-30 ENCOUNTER — Other Ambulatory Visit: Payer: Self-pay

## 2016-10-02 ENCOUNTER — Other Ambulatory Visit (INDEPENDENT_AMBULATORY_CARE_PROVIDER_SITE_OTHER): Payer: BLUE CROSS/BLUE SHIELD

## 2016-10-02 DIAGNOSIS — R7309 Other abnormal glucose: Secondary | ICD-10-CM | POA: Diagnosis not present

## 2016-10-02 LAB — POCT GLYCOSYLATED HEMOGLOBIN (HGB A1C): HEMOGLOBIN A1C: 5.4

## 2017-05-21 ENCOUNTER — Telehealth: Payer: Self-pay | Admitting: Family Medicine

## 2017-05-21 ENCOUNTER — Other Ambulatory Visit: Payer: Self-pay

## 2017-05-21 DIAGNOSIS — L600 Ingrowing nail: Secondary | ICD-10-CM

## 2017-05-21 NOTE — Telephone Encounter (Signed)
Called to  Let pt know that we sent an referral to [facility] .

## 2017-05-21 NOTE — Telephone Encounter (Signed)
  Patient having problem with ingrown toenail again You told him that if happened again, to see podiatrist He wants to know who you recommend  Please call

## 2017-05-21 NOTE — Telephone Encounter (Signed)
Either of the 2 podiatry places that we normally use

## 2017-05-28 ENCOUNTER — Ambulatory Visit: Payer: BLUE CROSS/BLUE SHIELD | Admitting: Podiatry

## 2017-06-07 ENCOUNTER — Ambulatory Visit (INDEPENDENT_AMBULATORY_CARE_PROVIDER_SITE_OTHER): Payer: BLUE CROSS/BLUE SHIELD | Admitting: Podiatry

## 2017-06-07 ENCOUNTER — Encounter: Payer: Self-pay | Admitting: Podiatry

## 2017-06-07 VITALS — BP 146/95 | HR 74 | Resp 16

## 2017-06-07 DIAGNOSIS — L600 Ingrowing nail: Secondary | ICD-10-CM

## 2017-06-07 NOTE — Patient Instructions (Signed)

## 2017-06-07 NOTE — Progress Notes (Signed)
   Subjective:    Patient ID: Frank Orozco, male    DOB: 1970-09-29, 47 y.o.   MRN: [id number]  HPI    Review of Systems  All other systems reviewed and are negative.      Objective:   Physical Exam        Assessment & Plan:

## 2017-06-09 NOTE — Progress Notes (Signed)
Subjective:   Patient ID: Frank Orozco, male   DOB: 47 y.o.   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  Patient states he soaked it and tried to trim it himself and is not been able to get it out   Review of Systems  All other systems reviewed and are negative.       Objective:  Physical Exam  Constitutional: He appears well-developed and well-nourished.  Cardiovascular: Intact distal pulses.  Pulmonary/Chest: Effort normal.  Musculoskeletal: Normal range of motion.  Neurological: He is alert.  Skin: Skin is warm.  Nursing note and vitals reviewed.   Neurovascular status intact muscle strength adequate range of motion within normal limits with patient found to have an incurvated hallux nail left lateral border that is painful when pressed and making shoe gear difficult.  I did not note redness or active drainage but it is inflamed and tender with palpation.  Patient is noted to have good digital perfusion and is well oriented x3     Assessment:  Ingrown toenail deformity left hallux lateral border that is very painful when pressed and making shoe gear difficult.     Plan:  H&P condition reviewed and recommended removal of the nail border.  I explained procedure and risk including discoloration of nail and loss of nail plate.  Patient wants surgery understanding risk and today I infiltrated the left hallux 60 mg Xylocaine Marcaine mixture and remove the lateral border exposed matrix and applied phenol 3 applications 30 seconds followed by alcohol lavage sterile dressing.  Gave instructions on soaks and reappoint

## 2017-07-01 ENCOUNTER — Emergency Department (HOSPITAL_COMMUNITY)
Admission: EM | Admit: 2017-07-01 | Discharge: 2017-07-01 | Disposition: A | Discharge status: Home / Self Care | Payer: BLUE CROSS/BLUE SHIELD | Attending: Emergency Medicine | Admitting: Emergency Medicine

## 2017-07-01 ENCOUNTER — Encounter (HOSPITAL_COMMUNITY): Payer: Self-pay | Admitting: Emergency Medicine

## 2017-07-01 ENCOUNTER — Emergency Department (HOSPITAL_COMMUNITY): Payer: BLUE CROSS/BLUE SHIELD

## 2017-07-01 DIAGNOSIS — Y998 Other external cause status: Secondary | ICD-10-CM | POA: Insufficient documentation

## 2017-07-01 DIAGNOSIS — S99921A Unspecified injury of right foot, initial encounter: Secondary | ICD-10-CM | POA: Diagnosis not present

## 2017-07-01 DIAGNOSIS — Y9389 Activity, other specified: Secondary | ICD-10-CM | POA: Insufficient documentation

## 2017-07-01 DIAGNOSIS — W208XXA Other cause of strike by thrown, projected or falling object, initial encounter: Secondary | ICD-10-CM | POA: Diagnosis not present

## 2017-07-01 DIAGNOSIS — Y929 Unspecified place or not applicable: Secondary | ICD-10-CM | POA: Insufficient documentation

## 2017-07-01 DIAGNOSIS — S90414A Abrasion, right lesser toe(s), initial encounter: Secondary | ICD-10-CM | POA: Diagnosis not present

## 2017-07-01 DIAGNOSIS — M79671 Pain in right foot: Secondary | ICD-10-CM | POA: Diagnosis not present

## 2017-07-01 NOTE — ED Notes (Signed)
Patient Alert and oriented to baseline. Stable and ambulatory to baseline. Patient verbalized understanding of the discharge instructions.  Patient belongings were taken by the patient.   

## 2017-07-01 NOTE — ED Triage Notes (Signed)
Pt c/o right foot pain after dropping a metal plate on it at work.

## 2017-07-01 NOTE — ED Provider Notes (Signed)
[facility] EMERGENCY DEPARTMENT Provider Note   CSN: [id number] Arrival date & time: 07/01/17  2036     History   Chief Complaint Chief Complaint  Patient presents with  . Foot Injury    HPI RAYJON WERY is a 47 y.o. male.  HPI   Mr. Rahm is a 47 year old male with a history of seasonal allergies who presents the emergency department for evaluation of toe injury.  Patient states that around 8 PM this evening he dropped a ceramic plate on his right fourth toe.  He noticed bleeding and that the skin on the top of the toe had been torn which prompted him to come to the emergency department.  Denies pain at this time.  States that he has mild toe pain with ambulation.  He was able to control the bleeding with pressure.  Denies fevers, chills, numbness, weakness, injury elsewhere.  Has history of diabetes, HIV or immunocompromised state.  Per chart review patient's Tdap was updated 04/2015.  Past Medical History:  Diagnosis Date  . Allergy   . Erectile dysfunction     Patient Active Problem List   Diagnosis Date Noted  . Vitamin D deficiency 04/12/2015  . IFG (impaired fasting glucose) 04/12/2015  . Obesity (BMI 30-39.9) 06/05/2013    Past Surgical History:  Procedure Laterality Date  . VASECTOMY Bilateral 11/09/2012   Procedure: BILATERAL VASECTOMY;  Surgeon: Molli Hazard, MD;  Location: [facility]S;  Service: Urology;  Laterality: Bilateral;  SPERMATIC CORD BLOCK    . WISDOM TOOTH EXTRACTION  2010       Home Medications    Prior to Admission medications   Not on File    Family History Family History  Problem Relation Age of Onset  . Arthritis Mother     Social History Social History   Tobacco Use  . Smoking status: Never Smoker  . Smokeless tobacco: Never Used  Substance Use Topics  . Alcohol use: Yes    Comment: 3 drinks a month  . Drug use: No     Allergies   Patient has no known allergies.   Review of  Systems Review of Systems  Constitutional: Negative for chills and fever.  Musculoskeletal: Positive for arthralgias (right fourth toe). Negative for joint swelling.  Skin: Positive for wound (abrasion over right fourth toe).  Neurological: Negative for weakness and numbness.     Physical Exam Updated Vital Signs BP (!) 148/90   Pulse 93   Temp 98.5 F (36.9 C) (Oral)   Resp 18   Ht 5' 10"  (1.778 m)   Wt 106.6 kg (235 lb)   SpO2 98%   BMI 33.72 kg/m   Physical Exam  Constitutional: He is oriented to person, place, and time. He appears well-developed and well-nourished. No distress.  HENT:  Head: Normocephalic and atraumatic.  Eyes: Right eye exhibits no discharge. Left eye exhibits no discharge.  Pulmonary/Chest: Effort normal. No respiratory distress.  Musculoskeletal:       Feet:  Approximately 0.5 cm shallow abrasion on the top of the right fourth toe.  It is not actively bleeding.  No surrounding erythema, warmth or purulence.  No overlying tenderness.  Patient able to move all toes.  Cap refill <2sec. Sensation to light touch intact in all 5 toes of the right foot.  Neurological: He is alert and oriented to person, place, and time. Coordination normal.  Skin: Skin is warm and dry. Capillary refill takes less than 2 seconds. He  is not diaphoretic.  Psychiatric: He has a normal mood and affect. His behavior is normal.  Nursing note and vitals reviewed.    ED Treatments / Results  Labs (all labs ordered are listed, but only abnormal results are displayed) Labs Reviewed - No data to display  EKG  EKG Interpretation None       Radiology Dg Foot Complete Right  Result Date: 07/01/2017 CLINICAL DATA:  Dropped plate on right foot tonight with fourth toe pain. EXAM: RIGHT FOOT COMPLETE - 3+ VIEW COMPARISON:  None. FINDINGS: Minimal degenerative change over the midfoot. No evidence of acute fracture or dislocation. IMPRESSION: No acute findings Electronically Signed    By: Marin Olp M.D.   On: 07/01/2017 21:23    Procedures Procedures (including critical care time)  Medications Ordered in ED Medications - No data to display   Initial Impression / Assessment and Plan / ED Course  I have reviewed the triage vital signs and the nursing notes.  Pertinent labs & imaging results that were available during my care of the patient were reviewed by me and considered in my medical decision making (see chart for details).     Patient with abrasion over the right fourth toe.  Foot is neurovascularly intact.  No signs of infection.  X-ray without acute fracture or abnormality.  Cleaned with iodine and sterile saline wash in the emergency department.  Antibiotic ointment applied and a Band-Aid placed on the toe.  Counseled patient on gently washing with warm soapy water daily to prevent infection and also covering with Band-Aid.  His blood pressure was elevated in the emergency department, counseled about this rechecked by his primary care doctor.  Patient agrees was understanding to the above plan.  Final Clinical Impressions(s) / ED Diagnoses   Final diagnoses:  Abrasion of toe of right foot, initial encounter    ED Discharge Orders    None       Bernarda Caffey 07/01/17 2207    Quintella Reichert, MD 07/02/17 1459

## 2017-07-01 NOTE — Discharge Instructions (Signed)
X-ray was reassuring.  No broken bones.  Please use antibiotic ointment and cover with Band-Aid.  Please gently wash with warm soapy water daily to prevent infection.

## 2018-04-26 DIAGNOSIS — W5501XA Bitten by cat, initial encounter: Secondary | ICD-10-CM | POA: Diagnosis not present

## 2018-04-26 DIAGNOSIS — L03114 Cellulitis of left upper limb: Secondary | ICD-10-CM | POA: Diagnosis not present

## 2018-04-26 DIAGNOSIS — S61452A Open bite of left hand, initial encounter: Secondary | ICD-10-CM | POA: Diagnosis not present

## 2018-04-26 DIAGNOSIS — S61432A Puncture wound without foreign body of left hand, initial encounter: Secondary | ICD-10-CM | POA: Diagnosis not present

## 2018-04-26 MED ORDER — SODIUM CHLORIDE 0.9 % IV SOLN
10.00 | INTRAVENOUS | Status: DC
Start: ? — End: 2018-04-26

## 2018-04-26 MED ORDER — GENERIC EXTERNAL MEDICATION
10.00 | Status: DC
Start: ? — End: 2018-04-26

## 2018-04-29 ENCOUNTER — Ambulatory Visit (INDEPENDENT_AMBULATORY_CARE_PROVIDER_SITE_OTHER): Payer: BLUE CROSS/BLUE SHIELD | Admitting: Family Medicine

## 2018-04-29 ENCOUNTER — Encounter: Payer: Self-pay | Admitting: Family Medicine

## 2018-04-29 VITALS — BP 118/78 | HR 83 | Temp 97.9°F | Wt 235.8 lb

## 2018-04-29 DIAGNOSIS — W5501XA Bitten by cat, initial encounter: Secondary | ICD-10-CM | POA: Diagnosis not present

## 2018-04-29 DIAGNOSIS — L03818 Cellulitis of other sites: Secondary | ICD-10-CM

## 2018-04-29 DIAGNOSIS — S61452A Open bite of left hand, initial encounter: Secondary | ICD-10-CM | POA: Diagnosis not present

## 2018-04-29 NOTE — Progress Notes (Addendum)
   Subjective:    Patient ID: Frank Orozco, male    DOB: Oct 11, 1970, 47 y.o.   MRN: [id number]  HPI He is here for recheck on cellulitis of the hand after a cat bite on12/23.  He was seen initially and placed on Augmentin however had worsening of his symptoms and did go to the emergency room.  An I&D was performed with minimal amount of blood removed.  He was given Rocephin and continued on his Augmentin.  They did mark the area of swelling and induration and he is here for follow-up.  He does state that he is doing much better.   Review of Systems     Objective:   Physical Exam Alert and in no distress.  Exam of the dorsum of the left hand does show much less swelling and erythema in the area demarcated by the ink pen.       Assessment & Plan:  Cat bite of hand, left, initial encounter  Cellulitis of other specified site The wound seems to be healing nicely.  He will continue on his Augmentin until he finishes this with will be in several days.  He will call if it is not entirely back to normal when he finishes the antibiotic

## 2020-10-28 ENCOUNTER — Encounter: Payer: Self-pay | Admitting: Family Medicine
